# Patient Record
Sex: Male | Born: 1995 | Hispanic: Yes | Marital: Single | Smoking: Never smoker
Health system: Southern US, Community
[De-identification: ages and names within clinical notes are randomized; demographics above are authoritative.]

---

## 2022-01-21 ENCOUNTER — Emergency Department (HOSPITAL_COMMUNITY): Payer: Medicaid Other

## 2022-01-21 ENCOUNTER — Inpatient Hospital Stay (HOSPITAL_COMMUNITY)
Admission: EM | Admit: 2022-01-21 | Discharge: 2022-02-04 | DRG: 082 | Disposition: A | Discharge status: Rehabilitation Facility | Payer: Medicaid Other | Attending: Surgery | Admitting: Surgery

## 2022-01-21 ENCOUNTER — Inpatient Hospital Stay (HOSPITAL_COMMUNITY): Payer: Medicaid Other

## 2022-01-21 DIAGNOSIS — J150 Pneumonia due to Klebsiella pneumoniae: Secondary | ICD-10-CM | POA: Diagnosis not present

## 2022-01-21 DIAGNOSIS — S60511A Abrasion of right hand, initial encounter: Secondary | ICD-10-CM | POA: Diagnosis not present

## 2022-01-21 DIAGNOSIS — F0781 Postconcussional syndrome: Secondary | ICD-10-CM | POA: Diagnosis not present

## 2022-01-21 DIAGNOSIS — J9601 Acute respiratory failure with hypoxia: Secondary | ICD-10-CM | POA: Diagnosis present

## 2022-01-21 DIAGNOSIS — S3991XA Unspecified injury of abdomen, initial encounter: Secondary | ICD-10-CM | POA: Diagnosis not present

## 2022-01-21 DIAGNOSIS — R001 Bradycardia, unspecified: Secondary | ICD-10-CM | POA: Diagnosis not present

## 2022-01-21 DIAGNOSIS — S069XAA Unspecified intracranial injury with loss of consciousness status unknown, initial encounter: Secondary | ICD-10-CM | POA: Diagnosis present

## 2022-01-21 DIAGNOSIS — R451 Restlessness and agitation: Secondary | ICD-10-CM | POA: Diagnosis not present

## 2022-01-21 DIAGNOSIS — S066XAA Traumatic subarachnoid hemorrhage with loss of consciousness status unknown, initial encounter: Principal | ICD-10-CM | POA: Diagnosis present

## 2022-01-21 DIAGNOSIS — I609 Nontraumatic subarachnoid hemorrhage, unspecified: Secondary | ICD-10-CM | POA: Diagnosis not present

## 2022-01-21 DIAGNOSIS — S0081XA Abrasion of other part of head, initial encounter: Secondary | ICD-10-CM | POA: Diagnosis present

## 2022-01-21 DIAGNOSIS — T1490XA Injury, unspecified, initial encounter: Principal | ICD-10-CM

## 2022-01-21 DIAGNOSIS — Z20822 Contact with and (suspected) exposure to covid-19: Secondary | ICD-10-CM | POA: Diagnosis present

## 2022-01-21 DIAGNOSIS — S069X9A Unspecified intracranial injury with loss of consciousness of unspecified duration, initial encounter: Secondary | ICD-10-CM

## 2022-01-21 DIAGNOSIS — Z23 Encounter for immunization: Secondary | ICD-10-CM

## 2022-01-21 LAB — I-STAT CHEM 8, ED
BUN: 13 mg/dL (ref 6–20)
Calcium, Ion: 1.1 mmol/L — ABNORMAL LOW (ref 1.15–1.40)
Chloride: 106 mmol/L (ref 98–111)
Creatinine, Ser: 1 mg/dL (ref 0.61–1.24)
Glucose, Bld: 134 mg/dL — ABNORMAL HIGH (ref 70–99)
HCT: 44 % (ref 39.0–52.0)
Hemoglobin: 15 g/dL (ref 13.0–17.0)
Potassium: 3.3 mmol/L — ABNORMAL LOW (ref 3.5–5.1)
Sodium: 142 mmol/L (ref 135–145)
TCO2: 25 mmol/L (ref 22–32)

## 2022-01-21 LAB — URINALYSIS, ROUTINE W REFLEX MICROSCOPIC
Bilirubin Urine: NEGATIVE
Glucose, UA: NEGATIVE mg/dL
Hgb urine dipstick: NEGATIVE
Ketones, ur: NEGATIVE mg/dL
Leukocytes,Ua: NEGATIVE
Nitrite: NEGATIVE
Protein, ur: NEGATIVE mg/dL
Specific Gravity, Urine: 1.031 — ABNORMAL HIGH (ref 1.005–1.030)
pH: 7 (ref 5.0–8.0)

## 2022-01-21 LAB — CBC
HCT: 44.5 % (ref 39.0–52.0)
Hemoglobin: 15.2 g/dL (ref 13.0–17.0)
MCH: 32.7 pg (ref 26.0–34.0)
MCHC: 34.2 g/dL (ref 30.0–36.0)
MCV: 95.7 fL (ref 80.0–100.0)
Platelets: 275 10*3/uL (ref 150–400)
RBC: 4.65 MIL/uL (ref 4.22–5.81)
RDW: 11.7 % (ref 11.5–15.5)
WBC: 7.9 10*3/uL (ref 4.0–10.5)
nRBC: 0 % (ref 0.0–0.2)

## 2022-01-21 LAB — COMPREHENSIVE METABOLIC PANEL
ALT: 28 U/L (ref 0–44)
AST: 28 U/L (ref 15–41)
Albumin: 3.8 g/dL (ref 3.5–5.0)
Alkaline Phosphatase: 77 U/L (ref 38–126)
Anion gap: 10 (ref 5–15)
BUN: 13 mg/dL (ref 6–20)
CO2: 21 mmol/L — ABNORMAL LOW (ref 22–32)
Calcium: 8.9 mg/dL (ref 8.9–10.3)
Chloride: 109 mmol/L (ref 98–111)
Creatinine, Ser: 1.15 mg/dL (ref 0.61–1.24)
GFR, Estimated: >60 mL/min (ref >60)
Glucose, Bld: 136 mg/dL — ABNORMAL HIGH (ref 70–99)
Potassium: 3.2 mmol/L — ABNORMAL LOW (ref 3.5–5.1)
Sodium: 140 mmol/L (ref 135–145)
Total Bilirubin: 0.9 mg/dL (ref 0.3–1.2)
Total Protein: 7 g/dL (ref 6.5–8.1)

## 2022-01-21 LAB — RAPID URINE DRUG SCREEN, HOSP PERFORMED
Amphetamines: NOT DETECTED
Barbiturates: NOT DETECTED
Benzodiazepines: POSITIVE — AB
Cocaine: NOT DETECTED
Opiates: NOT DETECTED
Tetrahydrocannabinol: POSITIVE — AB

## 2022-01-21 LAB — I-STAT ARTERIAL BLOOD GAS, ED
Acid-Base Excess: 0 mmol/L (ref 0.0–2.0)
Bicarbonate: 22.7 mmol/L (ref 20.0–28.0)
Calcium, Ion: 1.18 mmol/L (ref 1.15–1.40)
HCT: 42 % (ref 39.0–52.0)
Hemoglobin: 14.3 g/dL (ref 13.0–17.0)
O2 Saturation: 100 %
Patient temperature: 97.1
Potassium: 3.4 mmol/L — ABNORMAL LOW (ref 3.5–5.1)
Sodium: 139 mmol/L (ref 135–145)
TCO2: 24 mmol/L (ref 22–32)
pCO2 arterial: 30.4 mmHg — ABNORMAL LOW (ref 32–48)
pH, Arterial: 7.478 — ABNORMAL HIGH (ref 7.35–7.45)
pO2, Arterial: 376 mmHg — ABNORMAL HIGH (ref 83–108)

## 2022-01-21 LAB — RESP PANEL BY RT-PCR (FLU A&B, COVID) ARPGX2
Influenza A by PCR: NEGATIVE
Influenza B by PCR: NEGATIVE
SARS Coronavirus 2 by RT PCR: NEGATIVE

## 2022-01-21 LAB — TYPE AND SCREEN
ABO/RH(D): A POS
Antibody Screen: NEGATIVE

## 2022-01-21 LAB — LACTIC ACID, PLASMA: Lactic Acid, Venous: 3.6 mmol/L (ref 0.5–1.9)

## 2022-01-21 LAB — HIV ANTIBODY (ROUTINE TESTING W REFLEX): HIV Screen 4th Generation wRfx: NONREACTIVE

## 2022-01-21 LAB — ABO/RH: ABO/RH(D): A POS

## 2022-01-21 LAB — PROTIME-INR
INR: 1 (ref 0.8–1.2)
Prothrombin Time: 12.9 seconds (ref 11.4–15.2)

## 2022-01-21 LAB — ETHANOL: Alcohol, Ethyl (B): <10 mg/dL (ref <10)

## 2022-01-21 LAB — MRSA NEXT GEN BY PCR, NASAL: MRSA by PCR Next Gen: NOT DETECTED

## 2022-01-21 MED ORDER — PROPOFOL 1000 MG/100ML IV EMUL
0.0000 ug/kg/min | INTRAVENOUS | Status: DC
Start: 2022-01-21 — End: 2022-01-21

## 2022-01-21 MED ORDER — FENTANYL CITRATE PF 50 MCG/ML IJ SOSY
PREFILLED_SYRINGE | INTRAMUSCULAR | Status: AC
  Filled 2022-01-21: qty 2

## 2022-01-21 MED ORDER — ONDANSETRON HCL 4 MG/2ML IJ SOLN
4.0000 mg | Freq: Four times a day (QID) | INTRAMUSCULAR | Status: DC | PRN
  Administered 2022-01-31: 4 mg via INTRAVENOUS
  Filled 2022-01-21: qty 2

## 2022-01-21 MED ORDER — MIDAZOLAM HCL 2 MG/2ML IJ SOLN
INTRAMUSCULAR | Status: AC
  Filled 2022-01-21: qty 2

## 2022-01-21 MED ORDER — LEVETIRACETAM IN NACL 500 MG/100ML IV SOLN
500.0000 mg | Freq: Two times a day (BID) | INTRAVENOUS | Status: AC
  Administered 2022-01-21 – 2022-01-28 (×14): 500 mg via INTRAVENOUS
  Filled 2022-01-21 (×15): qty 100

## 2022-01-21 MED ORDER — SODIUM CHLORIDE 0.9 % IV SOLN: INTRAVENOUS | Status: DC

## 2022-01-21 MED ORDER — FENTANYL CITRATE PF 50 MCG/ML IJ SOSY
50.0000 ug | PREFILLED_SYRINGE | Freq: Once | INTRAMUSCULAR | Status: AC
  Administered 2022-01-21: 50 ug via INTRAVENOUS
  Filled 2022-01-21: qty 1

## 2022-01-21 MED ORDER — ROCURONIUM BROMIDE 10 MG/ML (PF) SYRINGE
PREFILLED_SYRINGE | INTRAVENOUS | Status: AC
  Filled 2022-01-21: qty 10

## 2022-01-21 MED ORDER — ETOMIDATE 2 MG/ML IV SOLN
INTRAVENOUS | Status: AC
  Filled 2022-01-21: qty 20

## 2022-01-21 MED ORDER — ROCURONIUM BROMIDE 50 MG/5ML IV SOLN
INTRAVENOUS | Status: AC | PRN
Start: 2022-01-21 — End: 2022-01-21
  Administered 2022-01-21: 100 mg via INTRAVENOUS

## 2022-01-21 MED ORDER — ORAL CARE MOUTH RINSE
15.0000 mL | OROMUCOSAL | Status: DC
  Administered 2022-01-21 – 2022-02-02 (×114): 15 mL via OROMUCOSAL

## 2022-01-21 MED ORDER — METOPROLOL TARTRATE 5 MG/5ML IV SOLN: 5.0000 mg | Freq: Four times a day (QID) | INTRAVENOUS | Status: DC | PRN

## 2022-01-21 MED ORDER — CEFAZOLIN SODIUM-DEXTROSE 2-4 GM/100ML-% IV SOLN
2.0000 g | Freq: Once | INTRAVENOUS | Status: AC
  Administered 2022-01-21: 2 g via INTRAVENOUS
  Filled 2022-01-21: qty 100

## 2022-01-21 MED ORDER — FENTANYL CITRATE PF 50 MCG/ML IJ SOSY: 50.0000 ug | PREFILLED_SYRINGE | Freq: Once | INTRAMUSCULAR | Status: DC

## 2022-01-21 MED ORDER — FENTANYL BOLUS VIA INFUSION
50.0000 ug | INTRAVENOUS | Status: DC | PRN
  Administered 2022-01-21 (×2): 50 ug via INTRAVENOUS
  Filled 2022-01-21: qty 100

## 2022-01-21 MED ORDER — SUCCINYLCHOLINE CHLORIDE 200 MG/10ML IV SOSY
PREFILLED_SYRINGE | INTRAVENOUS | Status: AC
  Filled 2022-01-21: qty 10

## 2022-01-21 MED ORDER — HYDRALAZINE HCL 20 MG/ML IJ SOLN: 10.0000 mg | INTRAMUSCULAR | Status: DC | PRN

## 2022-01-21 MED ORDER — TETANUS-DIPHTH-ACELL PERTUSSIS 5-2.5-18.5 LF-MCG/0.5 IM SUSY
0.5000 mL | PREFILLED_SYRINGE | Freq: Once | INTRAMUSCULAR | Status: AC
  Administered 2022-01-21: 0.5 mL via INTRAMUSCULAR
  Filled 2022-01-21: qty 0.5

## 2022-01-21 MED ORDER — IOHEXOL 300 MG/ML  SOLN
100.0000 mL | Freq: Once | INTRAMUSCULAR | Status: AC | PRN
  Administered 2022-01-21: 100 mL via INTRAVENOUS

## 2022-01-21 MED ORDER — PANTOPRAZOLE SODIUM 40 MG IV SOLR
40.0000 mg | Freq: Every day | INTRAVENOUS | Status: DC
  Administered 2022-01-21 – 2022-01-24 (×4): 40 mg via INTRAVENOUS
  Filled 2022-01-21 (×4): qty 10

## 2022-01-21 MED ORDER — CLEVIDIPINE BUTYRATE 0.5 MG/ML IV EMUL
0.0000 mg/h | INTRAVENOUS | Status: DC
  Administered 2022-01-21: 2 mg/h via INTRAVENOUS
  Filled 2022-01-21: qty 50

## 2022-01-21 MED ORDER — KETAMINE HCL 50 MG/5ML IJ SOSY
PREFILLED_SYRINGE | INTRAMUSCULAR | Status: AC
  Filled 2022-01-21: qty 5

## 2022-01-21 MED ORDER — FENTANYL 2500MCG IN NS 250ML (10MCG/ML) PREMIX INFUSION
50.0000 ug/h | INTRAVENOUS | Status: DC
  Administered 2022-01-21 – 2022-01-22 (×2): 100 ug/h via INTRAVENOUS
  Administered 2022-01-23 (×2): 200 ug/h via INTRAVENOUS
  Administered 2022-01-24: 150 ug/h via INTRAVENOUS
  Administered 2022-01-24: 200 ug/h via INTRAVENOUS
  Administered 2022-01-25 – 2022-01-26 (×4): 150 ug/h via INTRAVENOUS
  Administered 2022-01-27: 200 ug/h via INTRAVENOUS
  Administered 2022-01-27 – 2022-01-28 (×2): 175 ug/h via INTRAVENOUS
  Administered 2022-01-28 – 2022-01-29 (×3): 200 ug/h via INTRAVENOUS
  Filled 2022-01-21 (×15): qty 250

## 2022-01-21 MED ORDER — ETOMIDATE 2 MG/ML IV SOLN
INTRAVENOUS | Status: AC | PRN
  Administered 2022-01-21: 20 mg via INTRAVENOUS

## 2022-01-21 MED ORDER — FENTANYL BOLUS VIA INFUSION
50.0000 ug | INTRAVENOUS | Status: DC | PRN
  Administered 2022-01-22 – 2022-01-25 (×2): 100 ug via INTRAVENOUS
  Filled 2022-01-21: qty 100

## 2022-01-21 MED ORDER — CHLORHEXIDINE GLUCONATE 0.12% ORAL RINSE (MEDLINE KIT)
15.0000 mL | Freq: Two times a day (BID) | OROMUCOSAL | Status: DC
  Administered 2022-01-21 – 2022-02-01 (×23): 15 mL via OROMUCOSAL

## 2022-01-21 MED ORDER — SODIUM CHLORIDE 0.9 % IV SOLN: INTRAVENOUS | Status: AC

## 2022-01-21 MED ORDER — PROPOFOL 1000 MG/100ML IV EMUL
0.0000 ug/kg/min | INTRAVENOUS | Status: DC
  Administered 2022-01-21: 50 ug/kg/min via INTRAVENOUS
  Administered 2022-01-21: 45 ug/kg/min via INTRAVENOUS
  Administered 2022-01-21: 20 ug/kg/min via INTRAVENOUS
  Administered 2022-01-22: 40 ug/kg/min via INTRAVENOUS
  Administered 2022-01-22: 30 ug/kg/min via INTRAVENOUS
  Administered 2022-01-22 (×2): 40 ug/kg/min via INTRAVENOUS
  Administered 2022-01-23 – 2022-01-24 (×5): 30 ug/kg/min via INTRAVENOUS
  Administered 2022-01-24: 35 ug/kg/min via INTRAVENOUS
  Administered 2022-01-24: 30 ug/kg/min via INTRAVENOUS
  Administered 2022-01-24 – 2022-01-25 (×3): 40 ug/kg/min via INTRAVENOUS
  Administered 2022-01-25: 25 ug/kg/min via INTRAVENOUS
  Administered 2022-01-25 – 2022-01-26 (×3): 35 ug/kg/min via INTRAVENOUS
  Administered 2022-01-26 (×2): 30 ug/kg/min via INTRAVENOUS
  Administered 2022-01-26 – 2022-01-27 (×2): 40 ug/kg/min via INTRAVENOUS
  Administered 2022-01-27 (×2): 45 ug/kg/min via INTRAVENOUS
  Administered 2022-01-27: 35 ug/kg/min via INTRAVENOUS
  Administered 2022-01-28 (×2): 50 ug/kg/min via INTRAVENOUS
  Administered 2022-01-28: 40 ug/kg/min via INTRAVENOUS
  Administered 2022-01-28: 50 ug/kg/min via INTRAVENOUS
  Administered 2022-01-28: 40 ug/kg/min via INTRAVENOUS
  Administered 2022-01-28 – 2022-01-29 (×2): 50 ug/kg/min via INTRAVENOUS
  Filled 2022-01-21 (×9): qty 100
  Filled 2022-01-21: qty 200
  Filled 2022-01-21 (×21): qty 100
  Filled 2022-01-21: qty 200
  Filled 2022-01-21 (×4): qty 100

## 2022-01-21 MED ORDER — CHLORHEXIDINE GLUCONATE CLOTH 2 % EX PADS
6.0000 | MEDICATED_PAD | Freq: Every day | CUTANEOUS | Status: DC
  Administered 2022-01-21 – 2022-02-03 (×14): 6 via TOPICAL

## 2022-01-21 MED ORDER — PANTOPRAZOLE SODIUM 40 MG PO TBEC: 40.0000 mg | DELAYED_RELEASE_TABLET | Freq: Every day | ORAL | Status: DC

## 2022-01-21 MED ORDER — DEXAMETHASONE SODIUM PHOSPHATE 10 MG/ML IJ SOLN
8.0000 mg | Freq: Once | INTRAMUSCULAR | Status: AC
  Administered 2022-01-21: 8 mg via INTRAVENOUS
  Filled 2022-01-21: qty 1

## 2022-01-21 MED ORDER — FENTANYL 2500MCG IN NS 250ML (10MCG/ML) PREMIX INFUSION
50.0000 ug/h | INTRAVENOUS | Status: DC
  Administered 2022-01-21: 50 ug/h via INTRAVENOUS
  Filled 2022-01-21: qty 250

## 2022-01-21 MED ORDER — ONDANSETRON 4 MG PO TBDP: 4.0000 mg | ORAL_TABLET | Freq: Four times a day (QID) | ORAL | Status: DC | PRN

## 2022-01-21 MED ORDER — MIDAZOLAM HCL 5 MG/5ML IJ SOLN
INTRAMUSCULAR | Status: AC | PRN
  Administered 2022-01-21: 5 mg via INTRAMUSCULAR
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
## 2022-01-21 NOTE — ED Notes (Signed)
RN pulled 6 mg versed (3 vials) as override, discrepancy fixed with Jonathon (pharmacy). 5 mg versed given IM, 1 mg wasted by this RN and Scientist, forensic.   ?

## 2022-01-21 NOTE — Plan of Care (Signed)
  Problem: Pain Managment: Goal: General experience of comfort will improve Outcome: Progressing   Problem: Safety: Goal: Ability to remain free from injury will improve Outcome: Progressing   Problem: Skin Integrity: Goal: Risk for impaired skin integrity will decrease Outcome: Progressing   

## 2022-01-21 NOTE — ED Notes (Signed)
Patient transported to CT 
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
## 2022-01-21 NOTE — ED Notes (Addendum)
Dr Wallace Cullens aware of patient's HR in the 40s. ?
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
## 2022-01-21 NOTE — ED Notes (Signed)
Belongings given to wife - one earring, helmet, clothing, wallet ?

## 2022-01-21 NOTE — ED Notes (Signed)
Dr. Trenton Gammon, neurosurgery, at bedside ?

## 2022-01-21 NOTE — Progress Notes (Signed)
?   01/21/22 1800  ?Patient Belongings  ?Patient/Family advised about valuables policy? Yes  ?Home Medications No meds brought to hospital  ?Patient Belongings Sent with patient's designated person  ?Belongings with Patient's Designated Person Jewelry;Electronic device(s);Clothing  ?Name of Designated Person Reuben Likes Disla (cousin's wife)  ?With Designated Person: Jewelry Earrings  ? ? ?

## 2022-01-21 NOTE — ED Notes (Signed)
Dr. Wallace Cullens informed of patient's Lactic Acid of 3.6. No new verbal orders received. ?

## 2022-01-21 NOTE — ED Notes (Signed)
Family at bedside. 

## 2022-01-21 NOTE — Progress Notes (Signed)
Patient transported to CT and back to trauma C without complication. 
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
## 2022-01-22 ENCOUNTER — Inpatient Hospital Stay (HOSPITAL_COMMUNITY): Payer: Medicaid Other

## 2022-01-22 LAB — BASIC METABOLIC PANEL
Anion gap: 8 (ref 5–15)
BUN: 9 mg/dL (ref 6–20)
CO2: 21 mmol/L — ABNORMAL LOW (ref 22–32)
Calcium: 8.6 mg/dL — ABNORMAL LOW (ref 8.9–10.3)
Chloride: 110 mmol/L (ref 98–111)
Creatinine, Ser: 0.93 mg/dL (ref 0.61–1.24)
GFR, Estimated: >60 mL/min (ref >60)
Glucose, Bld: 122 mg/dL — ABNORMAL HIGH (ref 70–99)
Potassium: 3.2 mmol/L — ABNORMAL LOW (ref 3.5–5.1)
Sodium: 139 mmol/L (ref 135–145)

## 2022-01-22 LAB — GLUCOSE, CAPILLARY: Glucose-Capillary: 125 mg/dL — ABNORMAL HIGH (ref 70–99)

## 2022-01-22 LAB — CBC
HCT: 40.4 % (ref 39.0–52.0)
Hemoglobin: 13.9 g/dL (ref 13.0–17.0)
MCH: 32.1 pg (ref 26.0–34.0)
MCHC: 34.4 g/dL (ref 30.0–36.0)
MCV: 93.3 fL (ref 80.0–100.0)
Platelets: 222 10*3/uL (ref 150–400)
RBC: 4.33 MIL/uL (ref 4.22–5.81)
RDW: 11.7 % (ref 11.5–15.5)
WBC: 16.4 10*3/uL — ABNORMAL HIGH (ref 4.0–10.5)
nRBC: 0 % (ref 0.0–0.2)

## 2022-01-22 LAB — TRIGLYCERIDES: Triglycerides: 70 mg/dL (ref <150)

## 2022-01-22 MED ORDER — POTASSIUM CHLORIDE 10 MEQ/100ML IV SOLN
10.0000 meq | INTRAVENOUS | Status: AC
  Administered 2022-01-22 (×6): 10 meq via INTRAVENOUS
  Filled 2022-01-22 (×6): qty 100

## 2022-01-22 MED ORDER — MIDAZOLAM HCL 2 MG/2ML IJ SOLN
2.0000 mg | INTRAMUSCULAR | Status: DC | PRN
  Administered 2022-01-22 – 2022-01-29 (×15): 2 mg via INTRAVENOUS
  Filled 2022-01-22 (×16): qty 2

## 2022-01-22 NOTE — Progress Notes (Signed)
Trauma Event Note ? ?TRN rounding- pt asleep, primary RN voices no concerns.  ? ? ? ?Last imported Vital Signs ?BP (!) 109/57 (BP Location: Left Arm)   Pulse 83   Temp 97.6 ?F (36.4 ?C)   Resp 16   Ht 5\' 9"  (1.753 m)   Wt 202 lb 13.2 oz (92 kg)   SpO2 97%   BMI 29.95 kg/m?  ? ?Trending CBC ?Recent Labs  ?  01/21/22 ?1430 01/21/22 ?1441 01/21/22 ?1548 01/22/22 ?0256  ?WBC 7.9  --   --  16.4*  ?HGB 15.2 15.0 14.3 13.9  ?HCT 44.5 44.0 42.0 40.4  ?PLT 275  --   --  222  ? ? ?Trending Coag's ?Recent Labs  ?  01/21/22 ?1430  ?INR 1.0  ? ? ?Trending BMET ?Recent Labs  ?  01/21/22 ?1430 01/21/22 ?1441 01/21/22 ?1548 01/22/22 ?0256  ?NA 140 142 139 139  ?K 3.2* 3.3* 3.4* 3.2*  ?CL 109 106  --  110  ?CO2 21*  --   --  21*  ?BUN 13 13  --  9  ?CREATININE 1.15 1.00  --  0.93  ?GLUCOSE 136* 134*  --  122*  ? ? ? ? ?01/24/22 James Howell  ?Trauma Response RN ? ?Please call TRN at [phone] for further assistance. ? ? ?  ?

## 2022-01-22 NOTE — Progress Notes (Signed)
No new events or problems overnight.  Patient continues to be pharmacologically sedated.  Attempts at lessening sedation this morning were met with extreme agitation and attempts to get out of bed on his own.  Patient's strength appears to be equal bilaterally. ? ?Follow-up head CT scan demonstrates stable appearance of some scattered traumatic subarachnoid hemorrhage and intraventricular blood but no evidence of any worsening contusions.  No evidence of progressive edema or mass effect. ? ?Status post severe traumatic brain injury.  Continue sedation and supportive care for now.  Plan to work toward extubation possibly tomorrow. ?

## 2022-01-22 NOTE — Progress Notes (Signed)
Pt transported to CT and back to [id number] w/o complication. RT will cont to monitor.  ?

## 2022-01-22 NOTE — Progress Notes (Signed)
Dr. Fredricka Bonine notified of pt heart rate of 32 unsustained for approximately 30 seconds. RN able to increase HR through physical stimulation and propofol decrease. Fentanyl increased to maintain sedation. BP 150/95. Pt moving all extremities purposefully and beginning to bite on ET tube. PRN order for Versed given to manage agitation.  ?

## 2022-01-22 NOTE — Progress Notes (Signed)
Follow up - Trauma Critical Care ?  ?Patient Details:  ?  ?[facility] James Howell is an 26 y.o. male. ? ?Lines/tubes ?: ?Airway 8 mm (Active)  ?Secured at (cm) 26 cm 01/22/22 0810  ?Measured From Lips 01/22/22 0810  ?Secured Location Center 01/22/22 [phone]  ?Secured By [employer] 01/22/22 0810  ?Tube Holder Repositioned Yes 01/22/22 0810  ?Prone position No 01/22/22 0334  ?Cuff Pressure (cm H2O) Clear OR 27-39 CmH2O 01/22/22 0810  ?Site Condition Dry 01/22/22 0810  ?   ?NG/OG Vented/Dual Lumen 16 Fr. Oral Marking at nare/corner of mouth (Active)  ?Tube Position (Required) External length of tube 01/22/22 0800  ?Measurement (cm) (Required) 64 cm 01/22/22 0800  ?Ongoing Placement Verification (Required) (See row information) Yes 01/22/22 0800  ?Site Assessment Clean, Dry, Intact;Tape intact 01/22/22 0800  ?Interventions Clamped 01/22/22 0800  ?Status Clamped 01/22/22 0800  ?Output (mL) 50 mL 01/22/22 0600  ?   ?Urethral Catheter Leota Sauers RN Non-latex 16 Fr. (Active)  ?Indication for Insertion or Continuance of Catheter Unstable critically ill patients first 24-48 hours (See Criteria) 01/22/22 0800  ?Site Assessment Clean, Dry, Intact 01/22/22 0800  ?Catheter Maintenance Bag below level of bladder;Catheter secured;Drainage bag/tubing not touching floor;Insertion date on drainage bag;No dependent loops;Seal intact 01/22/22 0800  ?Collection Container Standard drainage bag 01/22/22 0800  ?Securement Method Securing device (Describe) 01/22/22 0800  ?Urinary Catheter Interventions (if applicable) Unclamped 01/22/22 0800  ?Output (mL) 750 mL 01/22/22 0400  ? ? ?Microbiology/Sepsis markers: ?Results for orders placed or performed during the hospital encounter of 01/21/22  ?Resp Panel by RT-PCR (Flu A&B, Covid) Nasopharyngeal Swab     Status: None  ? Collection Time: 01/21/22  3:41 PM  ? Specimen: Nasopharyngeal Swab; Nasopharyngeal(NP) swabs in vial transport medium  ?Result Value Ref Range Status  ? SARS  Coronavirus 2 by RT PCR NEGATIVE NEGATIVE Final  ?  Comment: (NOTE) ?SARS-CoV-2 target nucleic acids are NOT DETECTED. ? ?The SARS-CoV-2 RNA is generally detectable in upper respiratory ?specimens during the acute phase of infection. The lowest ?concentration of SARS-CoV-2 viral copies this assay can detect is ?138 copies/mL. A negative result does not preclude SARS-Cov-2 ?infection and should not be used as the sole basis for treatment or ?other patient management decisions. A negative result may occur with  ?improper specimen collection/handling, submission of specimen other ?than nasopharyngeal swab, presence of viral mutation(s) within the ?areas targeted by this assay, and inadequate number of viral ?copies(<138 copies/mL). A negative result must be combined with ?clinical observations, patient history, and epidemiological ?information. The expected result is Negative. ? ?Fact Sheet for Patients:  ?BloggerCourse.com ? ?Fact Sheet for Healthcare Providers:  ?SeriousBroker.it ? ?This test is no t yet approved or cleared by the [place] FDA and  ?has been authorized for detection and/or diagnosis of SARS-CoV-2 by ?FDA under an Emergency Use Authorization (EUA). This EUA will remain  ?in effect (meaning this test can be used) for the duration of the ?COVID-19 declaration under Section 564(b)(1) of the Act, 21 ?U.S.C.section 360bbb-3(b)(1), unless the authorization is terminated  ?or revoked sooner.  ? ? ?  ? Influenza A by PCR NEGATIVE NEGATIVE Final  ? Influenza B by PCR NEGATIVE NEGATIVE Final  ?  Comment: (NOTE) ?The Xpert Xpress SARS-CoV-2/FLU/RSV plus assay is intended as an aid ?in the diagnosis of influenza from Nasopharyngeal swab specimens and ?should not be used as a sole basis for treatment. Nasal washings and ?aspirates are unacceptable for Xpert Xpress SARS-CoV-2/FLU/RSV ?testing. ? ?Fact  Sheet for  Patients: ?BloggerCourse.comhttps://www.fda.gov/media/152166/download ? ?Fact Sheet for Healthcare Providers: ?SeriousBroker.ithttps://www.fda.gov/media/152162/download ? ?This test is not yet approved or cleared by the [place] FDA and ?has been authorized for detection and/or diagnosis of SARS-CoV-2 by ?FDA under an Emergency Use Authorization (EUA). This EUA will remain ?in effect (meaning this test can be used) for the duration of the ?COVID-19 declaration under Section 564(b)(1) of the Act, 21 U.S.C. ?section 360bbb-3(b)(1), unless the authorization is terminated or ?revoked. ? ?Performed at [facility], [address] [place], [place] ?[address] ?  ?MRSA Next Gen by PCR, Nasal     Status: None  ? Collection Time: 01/21/22  5:14 PM  ? Specimen: Nasal Mucosa; Nasal Swab  ?Result Value Ref Range Status  ? MRSA by PCR Next Gen NOT DETECTED NOT DETECTED Final  ?  Comment: (NOTE) ?The GeneXpert MRSA Assay (FDA approved for NASAL specimens only), ?is one component of a comprehensive MRSA colonization surveillance ?program. It is not intended to diagnose MRSA infection nor to guide ?or monitor treatment for MRSA infections. ?Test performance is not FDA approved in patients less than 2 years ?old. ?Performed at [facility], [address] [place], [place] ?[address] ?  ? ? ?Anti-infectives:  ?Anti-infectives (From admission, onward)  ? ? Start     Dose/Rate Route Frequency Ordered Stop  ? 01/21/22 1445  ceFAZolin (ANCEF) IVPB 2g/100 mL premix       ? 2 g ?200 mL/hr over 30 Minutes Intravenous  Once 01/21/22 1438 01/21/22 1543  ? ?  ? ? ?Best Practice/Protocols:  ?VTE Prophylaxis: Mechanical ?Continous Sedation ? ?Consults: ?Treatment Team:  ?Md, Trauma, MD ?[name], [name], MD  ? ? ?Studies: ? ? ? ?Events: ? ?Subjective:  ?  ?Overnight Issues: No acute events. Some bradycardia overnight but stable. Agitated when sedation weaned.  ? ?Objective:  ?Vital signs for last 24 hours: ?Temp:  [95.9 ?F (35.5 ?C)-98.4 ?F (36.9 ?C)] 97.6 ?F (36.4  ?C) (04/23 0800) ?Pulse Rate:  [44-103] 83 (04/23 0810) ?Resp:  [12-24] 16 (04/23 0810) ?BP: (102-176)/(52-120) 109/57 (04/23 0800) ?SpO2:  [77 %-100 %] 97 % (04/23 0810) ?FiO2 (%):  [60 %-100 %] 60 % (04/23 0810) ?Weight:  [92 kg] 92 kg (04/22 1710) ? ?Hemodynamic parameters for last 24 hours: ?  ? ?Intake/Output from previous day: ?04/22 0701 - 04/23 0700 ?In: 2382.7 [I.V.:2082.3; IV Piggyback:300.3] ?Out: 1675 [Urine:1550; Emesis/NG output:125]  ?Intake/Output this shift: ?Total I/O ?In: 310.7 [I.V.:310.7] ?Out: -  ? ?Vent settings for last 24 hours: ?Vent Mode: PRVC ?FiO2 (%):  [60 %-100 %] 60 % ?Set Rate:  [16 bmp-18 bmp] 16 bmp ?Vt Set:  [550 mL-560 mL] 560 mL ?PEEP:  [5 cmH20] 5 cmH20 ?Plateau Pressure:  [11 cmH20-18 cmH20] 14 cmH20 ? ?Physical Exam:  ?Sedated, calm ?On vent 60%/PEEP 5, sats97% ?HR 80s, normotensive ?Contusion to left brow, ?PERRL, purposeful with all extremities but not f/c ?No extremity edema or deformity ? ?Results for orders placed or performed during the hospital encounter of 01/21/22 (from the past 24 hour(s))  ?Comprehensive metabolic panel     Status: Abnormal  ? Collection Time: 01/21/22  2:30 PM  ?Result Value Ref Range  ? Sodium 140 135 - 145 mmol/L  ? Potassium 3.2 (L) 3.5 - 5.1 mmol/L  ? Chloride 109 98 - 111 mmol/L  ? CO2 21 (L) 22 - 32 mmol/L  ? Glucose, Bld 136 (H) 70 - 99 mg/dL  ? BUN 13 6 - 20 mg/dL  ?  Creatinine, Ser 1.15 0.61 - 1.24 mg/dL  ? Calcium 8.9 8.9 - 10.3 mg/dL  ? Total Protein 7.0 6.5 - 8.1 g/dL  ? Albumin 3.8 3.5 - 5.0 g/dL  ? AST 28 15 - 41 U/L  ? ALT 28 0 - 44 U/L  ? Alkaline Phosphatase 77 38 - 126 U/L  ? Total Bilirubin 0.9 0.3 - 1.2 mg/dL  ? GFR, Estimated >60 >60 mL/min  ? Anion gap 10 5 - 15  ?CBC     Status: None  ? Collection Time: 01/21/22  2:30 PM  ?Result Value Ref Range  ? WBC 7.9 4.0 - 10.5 K/uL  ? RBC 4.65 4.22 - 5.81 MIL/uL  ? Hemoglobin 15.2 13.0 - 17.0 g/dL  ? HCT 44.5 39.0 - 52.0 %  ? MCV 95.7 80.0 - 100.0 fL  ? MCH 32.7 26.0 - 34.0 pg  ? MCHC  34.2 30.0 - 36.0 g/dL  ? RDW 11.7 11.5 - 15.5 %  ? Platelets 275 150 - 400 K/uL  ? nRBC 0.0 0.0 - 0.2 %  ?Ethanol     Status: None  ? Collection Time: 01/21/22  2:30 PM  ?Result Value Ref Range  ? Alcohol, Ethyl (B) <10 <10 mg/dL  ?Lactic acid, plasma     S

## 2022-01-22 NOTE — Progress Notes (Signed)
Dr. Fredricka Bonine notified of pt bradycardia episodes of low 40s. BP 108/57 (72), pt moving all extremities purposefully. RN instructed to continue current therapeutic regimen since patient's status is stable at this time.  ?

## 2022-01-23 ENCOUNTER — Inpatient Hospital Stay (HOSPITAL_COMMUNITY): Payer: Medicaid Other

## 2022-01-23 LAB — PHOSPHORUS
Phosphorus: 2.2 mg/dL — ABNORMAL LOW (ref 2.5–4.6)
Phosphorus: 2.5 mg/dL (ref 2.5–4.6)

## 2022-01-23 LAB — MAGNESIUM
Magnesium: 1.8 mg/dL (ref 1.7–2.4)
Magnesium: 2.4 mg/dL (ref 1.7–2.4)

## 2022-01-23 LAB — BASIC METABOLIC PANEL
Anion gap: 5 (ref 5–15)
BUN: 11 mg/dL (ref 6–20)
CO2: 22 mmol/L (ref 22–32)
Calcium: 8.2 mg/dL — ABNORMAL LOW (ref 8.9–10.3)
Chloride: 116 mmol/L — ABNORMAL HIGH (ref 98–111)
Creatinine, Ser: 1.03 mg/dL (ref 0.61–1.24)
GFR, Estimated: >60 mL/min (ref >60)
Glucose, Bld: 96 mg/dL (ref 70–99)
Potassium: 3.2 mmol/L — ABNORMAL LOW (ref 3.5–5.1)
Sodium: 143 mmol/L (ref 135–145)

## 2022-01-23 LAB — GLUCOSE, CAPILLARY
Glucose-Capillary: 100 mg/dL — ABNORMAL HIGH (ref 70–99)
Glucose-Capillary: 102 mg/dL — ABNORMAL HIGH (ref 70–99)
Glucose-Capillary: 83 mg/dL (ref 70–99)
Glucose-Capillary: 127 mg/dL — ABNORMAL HIGH (ref 70–99)

## 2022-01-23 MED ORDER — PROSOURCE TF PO LIQD
45.0000 mL | Freq: Two times a day (BID) | ORAL | Status: DC
  Administered 2022-01-23: 45 mL
  Filled 2022-01-23: qty 45

## 2022-01-23 MED ORDER — CLONAZEPAM 0.5 MG PO TABS
0.5000 mg | ORAL_TABLET | Freq: Two times a day (BID) | ORAL | Status: DC
  Administered 2022-01-23 (×2): 0.5 mg
  Filled 2022-01-23 (×2): qty 1

## 2022-01-23 MED ORDER — POLYETHYLENE GLYCOL 3350 17 G PO PACK
17.0000 g | PACK | Freq: Every day | ORAL | Status: DC
  Administered 2022-01-23 – 2022-01-25 (×3): 17 g
  Filled 2022-01-23 (×3): qty 1

## 2022-01-23 MED ORDER — PIVOT 1.5 CAL PO LIQD
1000.0000 mL | ORAL | Status: DC
  Administered 2022-01-23 – 2022-02-01 (×10): 1000 mL
  Filled 2022-01-23 (×2): qty 1000

## 2022-01-23 MED ORDER — VITAL HIGH PROTEIN PO LIQD: 1000.0000 mL | ORAL | Status: AC

## 2022-01-23 MED ORDER — VITAL HIGH PROTEIN PO LIQD
1000.0000 mL | ORAL | Status: DC
  Administered 2022-01-23: 1000 mL

## 2022-01-23 MED ORDER — VITAL HIGH PROTEIN PO LIQD: 1000.0000 mL | ORAL | Status: DC

## 2022-01-23 MED ORDER — DOCUSATE SODIUM 50 MG/5ML PO LIQD
100.0000 mg | Freq: Two times a day (BID) | ORAL | Status: DC
Start: 2022-01-23 — End: 2022-02-02
  Administered 2022-01-23 – 2022-01-31 (×13): 100 mg
  Filled 2022-01-23 (×14): qty 10

## 2022-01-23 MED ORDER — QUETIAPINE FUMARATE 100 MG PO TABS
100.0000 mg | ORAL_TABLET | Freq: Two times a day (BID) | ORAL | Status: DC
  Administered 2022-01-23 – 2022-01-30 (×15): 100 mg
  Filled 2022-01-23 (×15): qty 1

## 2022-01-23 MED ORDER — MAGNESIUM SULFATE 2 GM/50ML IV SOLN
2.0000 g | Freq: Once | INTRAVENOUS | Status: AC
  Administered 2022-01-23: 2 g via INTRAVENOUS
  Filled 2022-01-23: qty 50

## 2022-01-23 MED ORDER — POTASSIUM CHLORIDE 20 MEQ PO PACK
40.0000 meq | PACK | ORAL | Status: AC
  Administered 2022-01-23 (×2): 40 meq
  Filled 2022-01-23 (×2): qty 2
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
## 2022-01-23 NOTE — Progress Notes (Signed)
Remains sedated on ventilator.  Still with severe agitation when sedation lessened.  Moving all 4 extremities well.  Strength equal.  Pupils small. ? ?Status post traumatic brain injury.  Continue supportive efforts.  No new recommendations. ?

## 2022-01-24 ENCOUNTER — Inpatient Hospital Stay (HOSPITAL_COMMUNITY): Payer: Medicaid Other

## 2022-01-24 LAB — GLUCOSE, CAPILLARY
Glucose-Capillary: 146 mg/dL — ABNORMAL HIGH (ref 70–99)
Glucose-Capillary: 129 mg/dL — ABNORMAL HIGH (ref 70–99)
Glucose-Capillary: 160 mg/dL — ABNORMAL HIGH (ref 70–99)
Glucose-Capillary: 126 mg/dL — ABNORMAL HIGH (ref 70–99)
Glucose-Capillary: 133 mg/dL — ABNORMAL HIGH (ref 70–99)
Glucose-Capillary: 141 mg/dL — ABNORMAL HIGH (ref 70–99)

## 2022-01-24 LAB — CBC
HCT: 37.7 % — ABNORMAL LOW (ref 39.0–52.0)
Hemoglobin: 12.7 g/dL — ABNORMAL LOW (ref 13.0–17.0)
MCH: 32.5 pg (ref 26.0–34.0)
MCHC: 33.7 g/dL (ref 30.0–36.0)
MCV: 96.4 fL (ref 80.0–100.0)
Platelets: 184 10*3/uL (ref 150–400)
RBC: 3.91 MIL/uL — ABNORMAL LOW (ref 4.22–5.81)
RDW: 11.9 % (ref 11.5–15.5)
WBC: 16.3 10*3/uL — ABNORMAL HIGH (ref 4.0–10.5)
nRBC: 0 % (ref 0.0–0.2)

## 2022-01-24 LAB — BASIC METABOLIC PANEL
Anion gap: 7 (ref 5–15)
BUN: 10 mg/dL (ref 6–20)
CO2: 20 mmol/L — ABNORMAL LOW (ref 22–32)
Calcium: 7.6 mg/dL — ABNORMAL LOW (ref 8.9–10.3)
Chloride: 109 mmol/L (ref 98–111)
Creatinine, Ser: 0.91 mg/dL (ref 0.61–1.24)
GFR, Estimated: >60 mL/min (ref >60)
Glucose, Bld: 121 mg/dL — ABNORMAL HIGH (ref 70–99)
Potassium: 3.6 mmol/L (ref 3.5–5.1)
Sodium: 136 mmol/L (ref 135–145)

## 2022-01-24 LAB — MAGNESIUM
Magnesium: 1.9 mg/dL (ref 1.7–2.4)
Magnesium: 2.1 mg/dL (ref 1.7–2.4)

## 2022-01-24 LAB — PHOSPHORUS
Phosphorus: 3.1 mg/dL (ref 2.5–4.6)
Phosphorus: 2.6 mg/dL (ref 2.5–4.6)

## 2022-01-24 MED ORDER — OXYCODONE HCL 5 MG/5ML PO SOLN
5.0000 mg | ORAL | Status: DC | PRN
  Administered 2022-02-01 – 2022-02-02 (×5): 5 mg
  Filled 2022-01-24 (×5): qty 5

## 2022-01-24 MED ORDER — METHOCARBAMOL 500 MG PO TABS
1000.0000 mg | ORAL_TABLET | Freq: Three times a day (TID) | ORAL | Status: DC
  Administered 2022-01-24 – 2022-02-02 (×26): 1000 mg
  Filled 2022-01-24 (×26): qty 2

## 2022-01-24 MED ORDER — OXYCODONE HCL 5 MG/5ML PO SOLN
10.0000 mg | ORAL | Status: DC | PRN
  Administered 2022-01-26 – 2022-02-02 (×9): 10 mg
  Filled 2022-01-24 (×10): qty 10

## 2022-01-24 MED ORDER — LACTULOSE 10 GM/15ML PO SOLN
10.0000 g | Freq: Two times a day (BID) | ORAL | Status: DC
  Administered 2022-01-24 – 2022-01-25 (×4): 10 g
  Filled 2022-01-24 (×4): qty 15

## 2022-01-24 MED ORDER — OXYCODONE HCL 5 MG/5ML PO SOLN: 5.0000 mg | ORAL | Status: DC | PRN

## 2022-01-24 MED ORDER — PANTOPRAZOLE 2 MG/ML SUSPENSION
40.0000 mg | Freq: Every day | ORAL | Status: DC
  Administered 2022-01-25 – 2022-01-29 (×5): 40 mg
  Filled 2022-01-24 (×5): qty 20

## 2022-01-24 MED ORDER — INSULIN ASPART 100 UNIT/ML IJ SOLN
0.0000 [IU] | INTRAMUSCULAR | Status: DC
  Administered 2022-01-24 – 2022-01-25 (×3): 1 [IU] via SUBCUTANEOUS
  Administered 2022-01-25 (×2): 2 [IU] via SUBCUTANEOUS
  Administered 2022-01-25 – 2022-02-01 (×20): 1 [IU] via SUBCUTANEOUS

## 2022-01-24 MED ORDER — SODIUM CHLORIDE 0.9 % IV SOLN
2.0000 g | Freq: Three times a day (TID) | INTRAVENOUS | Status: DC
  Administered 2022-01-24 – 2022-01-26 (×7): 2 g via INTRAVENOUS
  Filled 2022-01-24 (×6): qty 12.5

## 2022-01-24 MED ORDER — ACETAMINOPHEN 500 MG PO TABS
1000.0000 mg | ORAL_TABLET | Freq: Four times a day (QID) | ORAL | Status: DC
  Administered 2022-01-24 – 2022-02-02 (×33): 1000 mg
  Filled 2022-01-24 (×35): qty 2
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
## 2022-01-25 ENCOUNTER — Inpatient Hospital Stay (HOSPITAL_COMMUNITY): Payer: Medicaid Other

## 2022-01-25 LAB — TRIGLYCERIDES: Triglycerides: 172 mg/dL — ABNORMAL HIGH (ref <150)

## 2022-01-25 LAB — BASIC METABOLIC PANEL
Anion gap: 5 (ref 5–15)
BUN: 9 mg/dL (ref 6–20)
CO2: 26 mmol/L (ref 22–32)
Calcium: 7.9 mg/dL — ABNORMAL LOW (ref 8.9–10.3)
Chloride: 110 mmol/L (ref 98–111)
Creatinine, Ser: 0.94 mg/dL (ref 0.61–1.24)
GFR, Estimated: >60 mL/min (ref >60)
Glucose, Bld: 117 mg/dL — ABNORMAL HIGH (ref 70–99)
Potassium: 3.7 mmol/L (ref 3.5–5.1)
Sodium: 141 mmol/L (ref 135–145)

## 2022-01-25 LAB — GLUCOSE, CAPILLARY
Glucose-Capillary: 110 mg/dL — ABNORMAL HIGH (ref 70–99)
Glucose-Capillary: 115 mg/dL — ABNORMAL HIGH (ref 70–99)
Glucose-Capillary: 123 mg/dL — ABNORMAL HIGH (ref 70–99)
Glucose-Capillary: 148 mg/dL — ABNORMAL HIGH (ref 70–99)
Glucose-Capillary: 152 mg/dL — ABNORMAL HIGH (ref 70–99)
Glucose-Capillary: 155 mg/dL — ABNORMAL HIGH (ref 70–99)

## 2022-01-25 LAB — CBC
HCT: 35 % — ABNORMAL LOW (ref 39.0–52.0)
Hemoglobin: 12 g/dL — ABNORMAL LOW (ref 13.0–17.0)
MCH: 33 pg (ref 26.0–34.0)
MCHC: 34.3 g/dL (ref 30.0–36.0)
MCV: 96.2 fL (ref 80.0–100.0)
Platelets: 159 10*3/uL (ref 150–400)
RBC: 3.64 MIL/uL — ABNORMAL LOW (ref 4.22–5.81)
RDW: 11.9 % (ref 11.5–15.5)
WBC: 12.1 10*3/uL — ABNORMAL HIGH (ref 4.0–10.5)
nRBC: 0 % (ref 0.0–0.2)

## 2022-01-25 LAB — MAGNESIUM: Magnesium: 2.1 mg/dL (ref 1.7–2.4)

## 2022-01-25 LAB — PHOSPHORUS: Phosphorus: 2.2 mg/dL — ABNORMAL LOW (ref 2.5–4.6)

## 2022-01-25 MED ORDER — POTASSIUM & SODIUM PHOSPHATES 280-160-250 MG PO PACK
1.0000 | PACK | Freq: Three times a day (TID) | ORAL | Status: AC
  Administered 2022-01-25 (×3): 1
  Filled 2022-01-25 (×2): qty 1

## 2022-01-25 MED ORDER — POTASSIUM & SODIUM PHOSPHATES 280-160-250 MG PO PACK
1.0000 | PACK | Freq: Three times a day (TID) | ORAL | Status: DC
  Filled 2022-01-25: qty 1

## 2022-01-25 MED ORDER — ENOXAPARIN SODIUM 30 MG/0.3ML IJ SOSY
30.0000 mg | PREFILLED_SYRINGE | Freq: Two times a day (BID) | INTRAMUSCULAR | Status: DC
  Administered 2022-01-25 – 2022-02-03 (×20): 30 mg via SUBCUTANEOUS
  Filled 2022-01-25 (×22): qty 0.3

## 2022-01-25 MED ORDER — SENNA 8.6 MG PO TABS
2.0000 | ORAL_TABLET | Freq: Every day | ORAL | Status: DC
  Administered 2022-01-25 – 2022-01-31 (×4): 17.2 mg
  Filled 2022-01-25 (×5): qty 2
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
## 2022-01-25 NOTE — Progress Notes (Signed)
RT attempted CPT through the bed, but pt became very agitated. CPT was stopped and pt calmed back down. ?

## 2022-01-25 NOTE — TOC Progression Note (Signed)
Transition of Care (TOC) - Progression Note  ? ? ?Patient Details  ?Name: [name] ?MRN: [id number] ?Date of Birth: Jun 07, 1996 ? ?Transition of Care (TOC) CM/SW Contact  ?Glennon Mac, RN ?Phone Number: ?01/25/2022, 2:23 PM ? ?Clinical Narrative:    ?Letter prepared requesting assistance for patient's mother to obtain emergency VISA.   ?Emailed to [email or web address] and [email or web address], per their request.  ? ? ?Expected Discharge Plan: IP Rehab Facility ?Barriers to Discharge: Continued Medical Work up ? ?Expected Discharge Plan and Services ?Expected Discharge Plan: IP Rehab Facility ?  ?Discharge Planning Services: CM Consult ?  ?Living arrangements for the past 2 months: Apartment ?                ?  ?  ?  ?  ?  ?  ?  ?  ?  ?  ? ? ?Social Determinants of Health (SDOH) Interventions ?  ? ?Readmission Risk Interventions ?   ? View : No data to display.  ?  ?  ?  ? ?Quintella Baton, RN, BSN  ?Trauma/Neuro ICU Case Manager ?[phone] ? ? ?
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
## 2022-01-25 NOTE — Progress Notes (Signed)
Chest pt not done due to patient getting agitated. ?

## 2022-01-25 NOTE — Progress Notes (Signed)
ETT holder changed. 

## 2022-01-25 NOTE — Progress Notes (Signed)
ETT retraced 2 cm per MD order. ETT now placed 23 @ lip. ?

## 2022-01-25 NOTE — Progress Notes (Signed)
Trauma Event Note ? ? ?TRN at bedside to round. Patient intubated/sedated with fentanyl. No response to painful stimuli on my assessment. Family at bedside. VSS, no needs for TRN at this time. ? ?Last imported Vital Signs ?BP 112/68   Pulse 87   Temp 98.4 ?F (36.9 ?C) (Axillary)   Resp 20   Ht 5\' 9"  (1.753 m)   Wt 206 lb 9.1 oz (93.7 kg)   SpO2 92%   BMI 30.51 kg/m?  ? ?Trending CBC ?Recent Labs  ?  01/22/22 ?0256 01/24/22 ?0600  ?WBC 16.4* 16.3*  ?HGB 13.9 12.7*  ?HCT 40.4 37.7*  ?PLT 222 184  ? ? ?Trending Coag's ?No results for input(s): APTT, INR in the last 72 hours. ? ?Trending BMET ?Recent Labs  ?  01/22/22 ?0256 01/23/22 ?1005 01/24/22 ?0600  ?NA 139 143 136  ?K 3.2* 3.2* 3.6  ?CL 110 116* 109  ?CO2 21* 22 20*  ?BUN 9 11 10   ?CREATININE 0.93 1.03 0.91  ?GLUCOSE 122* 96 121*  ? ? ? ? ?Rondo Spittler O Gursimran Litaker  ?Trauma Response RN ? ?Please call TRN at [phone] for further assistance. ? ? ?  ?

## 2022-01-26 ENCOUNTER — Inpatient Hospital Stay (HOSPITAL_COMMUNITY): Payer: Medicaid Other

## 2022-01-26 LAB — CBC
HCT: 33.9 % — ABNORMAL LOW (ref 39.0–52.0)
Hemoglobin: 11.2 g/dL — ABNORMAL LOW (ref 13.0–17.0)
MCH: 32.2 pg (ref 26.0–34.0)
MCHC: 33 g/dL (ref 30.0–36.0)
MCV: 97.4 fL (ref 80.0–100.0)
Platelets: 189 10*3/uL (ref 150–400)
RBC: 3.48 MIL/uL — ABNORMAL LOW (ref 4.22–5.81)
RDW: 12.1 % (ref 11.5–15.5)
WBC: 10.5 10*3/uL (ref 4.0–10.5)
nRBC: 0 % (ref 0.0–0.2)

## 2022-01-26 LAB — BASIC METABOLIC PANEL
Anion gap: 6 (ref 5–15)
BUN: 9 mg/dL (ref 6–20)
CO2: 26 mmol/L (ref 22–32)
Calcium: 8 mg/dL — ABNORMAL LOW (ref 8.9–10.3)
Chloride: 109 mmol/L (ref 98–111)
Creatinine, Ser: 0.75 mg/dL (ref 0.61–1.24)
GFR, Estimated: >60 mL/min (ref >60)
Glucose, Bld: 129 mg/dL — ABNORMAL HIGH (ref 70–99)
Potassium: 3.4 mmol/L — ABNORMAL LOW (ref 3.5–5.1)
Sodium: 141 mmol/L (ref 135–145)

## 2022-01-26 LAB — POCT I-STAT 7, (LYTES, BLD GAS, ICA,H+H)
Acid-Base Excess: 1 mmol/L (ref 0.0–2.0)
Bicarbonate: 25.9 mmol/L (ref 20.0–28.0)
Calcium, Ion: 1.14 mmol/L — ABNORMAL LOW (ref 1.15–1.40)
HCT: 31 % — ABNORMAL LOW (ref 39.0–52.0)
Hemoglobin: 10.5 g/dL — ABNORMAL LOW (ref 13.0–17.0)
O2 Saturation: 93 %
Patient temperature: 98
Potassium: 3.4 mmol/L — ABNORMAL LOW (ref 3.5–5.1)
Sodium: 141 mmol/L (ref 135–145)
TCO2: 27 mmol/L (ref 22–32)
pCO2 arterial: 39.7 mmHg (ref 32–48)
pH, Arterial: 7.42 (ref 7.35–7.45)
pO2, Arterial: 65 mmHg — ABNORMAL LOW (ref 83–108)

## 2022-01-26 LAB — GLUCOSE, CAPILLARY
Glucose-Capillary: 122 mg/dL — ABNORMAL HIGH (ref 70–99)
Glucose-Capillary: 104 mg/dL — ABNORMAL HIGH (ref 70–99)
Glucose-Capillary: 132 mg/dL — ABNORMAL HIGH (ref 70–99)
Glucose-Capillary: 117 mg/dL — ABNORMAL HIGH (ref 70–99)
Glucose-Capillary: 127 mg/dL — ABNORMAL HIGH (ref 70–99)
Glucose-Capillary: 148 mg/dL — ABNORMAL HIGH (ref 70–99)

## 2022-01-26 LAB — CULTURE, RESPIRATORY W GRAM STAIN

## 2022-01-26 LAB — PHOSPHORUS: Phosphorus: 2.9 mg/dL (ref 2.5–4.6)

## 2022-01-26 MED ORDER — BISACODYL 5 MG PO TBEC
10.0000 mg | DELAYED_RELEASE_TABLET | Freq: Every day | ORAL | Status: DC
  Administered 2022-01-26: 10 mg via ORAL
  Filled 2022-01-26: qty 2

## 2022-01-26 MED ORDER — LACTULOSE 10 GM/15ML PO SOLN
20.0000 g | Freq: Three times a day (TID) | ORAL | Status: DC
  Administered 2022-01-26 (×3): 20 g
  Filled 2022-01-26 (×3): qty 30

## 2022-01-26 MED ORDER — POTASSIUM CHLORIDE 20 MEQ PO PACK
40.0000 meq | PACK | Freq: Two times a day (BID) | ORAL | Status: AC
  Administered 2022-01-26 (×2): 40 meq
  Filled 2022-01-26 (×2): qty 2

## 2022-01-26 MED ORDER — BISACODYL 10 MG RE SUPP
10.0000 mg | Freq: Once | RECTAL | Status: AC
  Administered 2022-01-26: 10 mg via RECTAL
  Filled 2022-01-26: qty 1

## 2022-01-26 MED ORDER — CEFAZOLIN SODIUM-DEXTROSE 2-4 GM/100ML-% IV SOLN
2.0000 g | Freq: Three times a day (TID) | INTRAVENOUS | Status: AC
  Administered 2022-01-26 – 2022-01-30 (×13): 2 g via INTRAVENOUS
  Filled 2022-01-26 (×13): qty 100

## 2022-01-26 MED ORDER — POLYETHYLENE GLYCOL 3350 17 G PO PACK
17.0000 g | PACK | Freq: Two times a day (BID) | ORAL | Status: DC
  Administered 2022-01-26 – 2022-01-31 (×6): 17 g
  Filled 2022-01-26 (×7): qty 1
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
## 2022-01-26 NOTE — Progress Notes (Signed)
Chest pt help at this time due to patient being agitated.  RN aware. ?
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
## 2022-01-26 NOTE — Progress Notes (Signed)
RT unable to do CPT at this time due to agitation. RT will try again at next scheduled time. ?
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
## 2022-01-27 LAB — BASIC METABOLIC PANEL
Anion gap: 6 (ref 5–15)
BUN: 10 mg/dL (ref 6–20)
CO2: 26 mmol/L (ref 22–32)
Calcium: 8.3 mg/dL — ABNORMAL LOW (ref 8.9–10.3)
Chloride: 106 mmol/L (ref 98–111)
Creatinine, Ser: 0.76 mg/dL (ref 0.61–1.24)
GFR, Estimated: >60 mL/min (ref >60)
Glucose, Bld: 120 mg/dL — ABNORMAL HIGH (ref 70–99)
Potassium: 4.1 mmol/L (ref 3.5–5.1)
Sodium: 138 mmol/L (ref 135–145)

## 2022-01-27 LAB — CBC
HCT: 35.3 % — ABNORMAL LOW (ref 39.0–52.0)
Hemoglobin: 11.8 g/dL — ABNORMAL LOW (ref 13.0–17.0)
MCH: 32.4 pg (ref 26.0–34.0)
MCHC: 33.4 g/dL (ref 30.0–36.0)
MCV: 97 fL (ref 80.0–100.0)
Platelets: 190 10*3/uL (ref 150–400)
RBC: 3.64 MIL/uL — ABNORMAL LOW (ref 4.22–5.81)
RDW: 12.1 % (ref 11.5–15.5)
WBC: 13.2 10*3/uL — ABNORMAL HIGH (ref 4.0–10.5)
nRBC: 0 % (ref 0.0–0.2)

## 2022-01-27 LAB — GLUCOSE, CAPILLARY
Glucose-Capillary: 113 mg/dL — ABNORMAL HIGH (ref 70–99)
Glucose-Capillary: 116 mg/dL — ABNORMAL HIGH (ref 70–99)
Glucose-Capillary: 112 mg/dL — ABNORMAL HIGH (ref 70–99)
Glucose-Capillary: 132 mg/dL — ABNORMAL HIGH (ref 70–99)
Glucose-Capillary: 126 mg/dL — ABNORMAL HIGH (ref 70–99)
Glucose-Capillary: 146 mg/dL — ABNORMAL HIGH (ref 70–99)

## 2022-01-27 NOTE — TOC Progression Note (Signed)
Transition of Care (TOC) - Progression Note  ? ? ?Patient Details  ?Name: James Howell ?MRN: [id number] ?Date of Birth: [birth year] ? ?Transition of Care (TOC) CM/SW Contact  ?Ella Bodo, RN ?Phone Number: ?01/27/2022, 3:28 PM ? ?Clinical Narrative:    ?Letter prepared and emailed to patient's sister James Howell to take to the [employer], in an attempt to obtain emergency VISA.   ?Emailed to [email or web address], per request.  ? ?Expected Discharge Plan: [facility] ?Barriers to Discharge: Continued Medical Work up ? ?Expected Discharge Plan and Services ?Expected Discharge Plan: [place] ?  ?Discharge Planning Services: CM Consult ?  ?Living arrangements for the past 2 months: Apartment ?                ?  ?  ?  ?  ?  ?  ?  ?  ?  ?  ? ? ?Social Determinants of Health (SDOH) Interventions ?  ? ?Readmission Risk Interventions ?   ? View : No data to display.  ?  ?  ?  ? ?[address], RN, BSN  ?Trauma/Neuro ICU Case Manager ?[phone] ? ?
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
## 2022-01-28 ENCOUNTER — Inpatient Hospital Stay (HOSPITAL_COMMUNITY): Payer: Medicaid Other

## 2022-01-28 LAB — GLUCOSE, CAPILLARY
Glucose-Capillary: 106 mg/dL — ABNORMAL HIGH (ref 70–99)
Glucose-Capillary: 129 mg/dL — ABNORMAL HIGH (ref 70–99)
Glucose-Capillary: 117 mg/dL — ABNORMAL HIGH (ref 70–99)
Glucose-Capillary: 122 mg/dL — ABNORMAL HIGH (ref 70–99)
Glucose-Capillary: 131 mg/dL — ABNORMAL HIGH (ref 70–99)

## 2022-01-28 LAB — TRIGLYCERIDES: Triglycerides: 93 mg/dL (ref <150)
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
## 2022-01-29 LAB — CBC
HCT: 34.3 % — ABNORMAL LOW (ref 39.0–52.0)
Hemoglobin: 11.4 g/dL — ABNORMAL LOW (ref 13.0–17.0)
MCH: 33 pg (ref 26.0–34.0)
MCHC: 33.2 g/dL (ref 30.0–36.0)
MCV: 99.4 fL (ref 80.0–100.0)
Platelets: 250 10*3/uL (ref 150–400)
RBC: 3.45 MIL/uL — ABNORMAL LOW (ref 4.22–5.81)
RDW: 12.5 % (ref 11.5–15.5)
WBC: 9.2 10*3/uL (ref 4.0–10.5)
nRBC: 0 % (ref 0.0–0.2)

## 2022-01-29 LAB — GLUCOSE, CAPILLARY
Glucose-Capillary: 111 mg/dL — ABNORMAL HIGH (ref 70–99)
Glucose-Capillary: 113 mg/dL — ABNORMAL HIGH (ref 70–99)
Glucose-Capillary: 116 mg/dL — ABNORMAL HIGH (ref 70–99)
Glucose-Capillary: 116 mg/dL — ABNORMAL HIGH (ref 70–99)
Glucose-Capillary: 117 mg/dL — ABNORMAL HIGH (ref 70–99)
Glucose-Capillary: 133 mg/dL — ABNORMAL HIGH (ref 70–99)
Glucose-Capillary: 139 mg/dL — ABNORMAL HIGH (ref 70–99)

## 2022-01-29 LAB — BASIC METABOLIC PANEL
Anion gap: 7 (ref 5–15)
BUN: 14 mg/dL (ref 6–20)
CO2: 27 mmol/L (ref 22–32)
Calcium: 8.5 mg/dL — ABNORMAL LOW (ref 8.9–10.3)
Chloride: 104 mmol/L (ref 98–111)
Creatinine, Ser: 0.74 mg/dL (ref 0.61–1.24)
GFR, Estimated: >60 mL/min (ref >60)
Glucose, Bld: 145 mg/dL — ABNORMAL HIGH (ref 70–99)
Potassium: 3.5 mmol/L (ref 3.5–5.1)
Sodium: 138 mmol/L (ref 135–145)

## 2022-01-29 MED ORDER — PROPOFOL 1000 MG/100ML IV EMUL
0.0000 ug/kg/min | INTRAVENOUS | Status: DC
  Administered 2022-01-29: 30 ug/kg/min via INTRAVENOUS
  Administered 2022-01-29 (×3): 50 ug/kg/min via INTRAVENOUS
  Filled 2022-01-29 (×4): qty 100
  Filled 2022-01-29: qty 200

## 2022-01-29 MED ORDER — DEXMEDETOMIDINE HCL IN NACL 400 MCG/100ML IV SOLN
0.0000 ug/kg/h | INTRAVENOUS | Status: AC
  Administered 2022-01-29: 0.6 ug/kg/h via INTRAVENOUS
  Administered 2022-01-29: 0.4 ug/kg/h via INTRAVENOUS
  Administered 2022-01-30 (×2): 1.2 ug/kg/h via INTRAVENOUS
  Administered 2022-01-30: 0.9 ug/kg/h via INTRAVENOUS
  Administered 2022-01-30: 1 ug/kg/h via INTRAVENOUS
  Administered 2022-01-30 – 2022-01-31 (×3): 1.2 ug/kg/h via INTRAVENOUS
  Administered 2022-01-31: 0.5 ug/kg/h via INTRAVENOUS
  Filled 2022-01-29 (×10): qty 100
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
## 2022-01-30 ENCOUNTER — Inpatient Hospital Stay (HOSPITAL_COMMUNITY): Payer: Medicaid Other

## 2022-01-30 ENCOUNTER — Encounter (HOSPITAL_COMMUNITY): Payer: Self-pay

## 2022-01-30 ENCOUNTER — Other Ambulatory Visit: Payer: Self-pay

## 2022-01-30 LAB — BASIC METABOLIC PANEL
Anion gap: 6 (ref 5–15)
BUN: 15 mg/dL (ref 6–20)
CO2: 30 mmol/L (ref 22–32)
Calcium: 8.5 mg/dL — ABNORMAL LOW (ref 8.9–10.3)
Chloride: 102 mmol/L (ref 98–111)
Creatinine, Ser: 0.84 mg/dL (ref 0.61–1.24)
GFR, Estimated: >60 mL/min (ref >60)
Glucose, Bld: 122 mg/dL — ABNORMAL HIGH (ref 70–99)
Potassium: 3.9 mmol/L (ref 3.5–5.1)
Sodium: 138 mmol/L (ref 135–145)

## 2022-01-30 LAB — GLUCOSE, CAPILLARY
Glucose-Capillary: 133 mg/dL — ABNORMAL HIGH (ref 70–99)
Glucose-Capillary: 115 mg/dL — ABNORMAL HIGH (ref 70–99)
Glucose-Capillary: 104 mg/dL — ABNORMAL HIGH (ref 70–99)
Glucose-Capillary: 119 mg/dL — ABNORMAL HIGH (ref 70–99)
Glucose-Capillary: 133 mg/dL — ABNORMAL HIGH (ref 70–99)
Glucose-Capillary: 119 mg/dL — ABNORMAL HIGH (ref 70–99)

## 2022-01-30 LAB — CBC
HCT: 32.1 % — ABNORMAL LOW (ref 39.0–52.0)
Hemoglobin: 10.5 g/dL — ABNORMAL LOW (ref 13.0–17.0)
MCH: 32.4 pg (ref 26.0–34.0)
MCHC: 32.7 g/dL (ref 30.0–36.0)
MCV: 99.1 fL (ref 80.0–100.0)
Platelets: 261 10*3/uL (ref 150–400)
RBC: 3.24 MIL/uL — ABNORMAL LOW (ref 4.22–5.81)
RDW: 12.1 % (ref 11.5–15.5)
WBC: 9.2 10*3/uL (ref 4.0–10.5)
nRBC: 0 % (ref 0.0–0.2)

## 2022-01-30 MED ORDER — HALOPERIDOL LACTATE 5 MG/ML IJ SOLN: 10.0000 mg | Freq: Four times a day (QID) | INTRAMUSCULAR | Status: DC | PRN

## 2022-01-30 MED ORDER — CLONAZEPAM 0.25 MG PO TBDP
0.5000 mg | ORAL_TABLET | Freq: Two times a day (BID) | ORAL | Status: DC
  Administered 2022-01-30 – 2022-02-02 (×7): 0.5 mg
  Filled 2022-01-30 (×7): qty 2

## 2022-01-30 MED ORDER — QUETIAPINE FUMARATE 200 MG PO TABS
200.0000 mg | ORAL_TABLET | Freq: Two times a day (BID) | ORAL | Status: DC
  Administered 2022-01-30 – 2022-02-02 (×5): 200 mg
  Filled 2022-01-30 (×6): qty 1

## 2022-01-30 MED ORDER — CLONAZEPAM 0.25 MG PO TBDP: 0.5000 mg | ORAL_TABLET | Freq: Two times a day (BID) | ORAL | Status: DC

## 2022-01-30 MED ORDER — HALOPERIDOL LACTATE 5 MG/ML IJ SOLN
10.0000 mg | Freq: Four times a day (QID) | INTRAMUSCULAR | Status: DC | PRN
  Administered 2022-01-30: 10 mg via INTRAVENOUS
  Administered 2022-01-31: 5 mg via INTRAVENOUS
  Filled 2022-01-30 (×3): qty 2

## 2022-01-30 NOTE — Procedures (Signed)
Extubation Procedure Note ? ?Patient Details:   ?Name: [facility] ?DOB: [birth year] ?MRN: [id number] ?  ?Airway Documentation:  ?  ?Vent end date: 01/30/22 Vent end time: 0843  ? ?Evaluation ? O2 sats: stable throughout ?Complications: No apparent complications ?Patient did tolerate procedure well. ?Bilateral Breath Sounds: Clear, Diminished ?  ?Yes ? ?Patient extubated per order to 4L [place] with no apparent complications. Positive cuff leak was noted prior to extubation. Patient is alert, has strong cough, and is able to weakly speak. Vitals are stable. RT will continue to monitor.  ? ?Collin Hendley Clyda Greener ?01/30/2022, 8:45 AM ? ?

## 2022-01-30 NOTE — Procedures (Signed)
Cortrak  Person Inserting Tube:  Yahel Fuston C, RD Tube Type:  Cortrak - 43 inches Tube Size:  10 Tube Location:  Left nare Secured by: Bridle Technique Used to Measure Tube Placement:  Marking at nare/corner of mouth Cortrak Secured At:  70 cm   Cortrak Tube Team Note:  Consult received to place a Cortrak feeding tube.   X-ray is required, abdominal x-ray has been ordered by the Cortrak team. Please confirm tube placement before using the Cortrak tube.   If the tube becomes dislodged please keep the tube and contact the Cortrak team at [email or web address] ([email or web address]) for replacement.  If after hours and replacement cannot be delayed, place a NG tube and confirm placement with an abdominal x-ray.    Albert Devaul P., RD, LDN, CNSC See AMiON for contact information    
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
## 2022-01-30 NOTE — Plan of Care (Signed)
?  Problem: Safety: Goal: Non-violent Restraint(s) Outcome: Progressing   Problem: Education: Goal: Knowledge of General Education information will improve Description: Including pain rating scale, medication(s)/side effects and non-pharmacologic comfort measures Outcome: Progressing   Problem: Health Behavior/Discharge Planning: Goal: Ability to manage health-related needs will improve Outcome: Progressing   Problem: Clinical Measurements: Goal: Ability to maintain clinical measurements within normal limits will improve Outcome: Progressing   Problem: Nutrition: Goal: Adequate nutrition will be maintained Outcome: Progressing   Problem: Pain Managment: Goal: General experience of comfort will improve Outcome: Progressing   Problem: Safety: Goal: Ability to remain free from injury will improve Outcome: Progressing   Problem: Skin Integrity: Goal: Risk for impaired skin integrity will decrease Outcome: Progressing   

## 2022-01-30 NOTE — Progress Notes (Signed)
SLP Cancellation Note ? ?Patient Details ?Name: [name] ?MRN: [id number] ?DOB: May 29, 1996 ? ? ?Cancelled treatment:       Reason Eval/Treat Not Completed: Other (comment). Pt extubated this am, has a cortrak in place and is currently resting quietly. Will f/u tomorrow for SLP evaluations.  ? ? ?Lewanna Petrak, Riley Nearing ?01/30/2022, 12:07 PM ?

## 2022-01-31 LAB — GLUCOSE, CAPILLARY
Glucose-Capillary: 102 mg/dL — ABNORMAL HIGH (ref 70–99)
Glucose-Capillary: 106 mg/dL — ABNORMAL HIGH (ref 70–99)
Glucose-Capillary: 110 mg/dL — ABNORMAL HIGH (ref 70–99)
Glucose-Capillary: 113 mg/dL — ABNORMAL HIGH (ref 70–99)
Glucose-Capillary: 125 mg/dL — ABNORMAL HIGH (ref 70–99)
Glucose-Capillary: 143 mg/dL — ABNORMAL HIGH (ref 70–99)

## 2022-01-31 LAB — CBC
HCT: 31.1 % — ABNORMAL LOW (ref 39.0–52.0)
Hemoglobin: 10.6 g/dL — ABNORMAL LOW (ref 13.0–17.0)
MCH: 33.2 pg (ref 26.0–34.0)
MCHC: 34.1 g/dL (ref 30.0–36.0)
MCV: 97.5 fL (ref 80.0–100.0)
Platelets: 282 10*3/uL (ref 150–400)
RBC: 3.19 MIL/uL — ABNORMAL LOW (ref 4.22–5.81)
RDW: 12.1 % (ref 11.5–15.5)
WBC: 11.1 10*3/uL — ABNORMAL HIGH (ref 4.0–10.5)
nRBC: 0 % (ref 0.0–0.2)

## 2022-01-31 LAB — BASIC METABOLIC PANEL
Anion gap: 6 (ref 5–15)
BUN: 19 mg/dL (ref 6–20)
CO2: 27 mmol/L (ref 22–32)
Calcium: 8.5 mg/dL — ABNORMAL LOW (ref 8.9–10.3)
Chloride: 106 mmol/L (ref 98–111)
Creatinine, Ser: 0.73 mg/dL (ref 0.61–1.24)
GFR, Estimated: >60 mL/min (ref >60)
Glucose, Bld: 135 mg/dL — ABNORMAL HIGH (ref 70–99)
Potassium: 3.6 mmol/L (ref 3.5–5.1)
Sodium: 139 mmol/L (ref 135–145)

## 2022-01-31 MED ORDER — POTASSIUM CHLORIDE 20 MEQ PO PACK
40.0000 meq | PACK | Freq: Once | ORAL | Status: AC
Start: 2022-01-31 — End: 2022-01-31
  Administered 2022-01-31: 40 meq
  Filled 2022-01-31: qty 2

## 2022-01-31 MED ORDER — DEXMEDETOMIDINE HCL IN NACL 400 MCG/100ML IV SOLN
0.4000 ug/kg/h | INTRAVENOUS | Status: DC
  Administered 2022-01-31: 0.5 ug/kg/h via INTRAVENOUS
  Administered 2022-02-01: 0.4 ug/kg/h via INTRAVENOUS
  Filled 2022-01-31 (×2): qty 100
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
## 2022-01-31 NOTE — Progress Notes (Addendum)
Patient tolerating extubation well.  Nonfocal exam.  Continue rehab efforts and mobilization.  No new   Recommendations ?
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
## 2022-01-31 NOTE — Progress Notes (Signed)
Inpatient Rehab Admissions Coordinator:   Per therapy recommendations, patient was screened for CIR candidacy by Tiauna Whisnant, MS, CCC-SLP . At this time, Pt. is not yet at a level to tolerate intensity of CIR; however,   Pt. may have potential to progress to becoming a potential CIR candidate, so CIR admissions team will follow and monitor for progress and participation with therapies and place consult order if Pt. appears to be an appropriate candidate. Please contact me with any questions.   Nayab Aten, MS, CCC-SLP Rehab Admissions Coordinator  [phone] (celll) [phone] (office)   
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
## 2022-02-01 ENCOUNTER — Inpatient Hospital Stay (HOSPITAL_COMMUNITY): Payer: Medicaid Other

## 2022-02-01 LAB — BASIC METABOLIC PANEL
Anion gap: 8 (ref 5–15)
BUN: 20 mg/dL (ref 6–20)
CO2: 26 mmol/L (ref 22–32)
Calcium: 8.9 mg/dL (ref 8.9–10.3)
Chloride: 105 mmol/L (ref 98–111)
Creatinine, Ser: 0.8 mg/dL (ref 0.61–1.24)
GFR, Estimated: >60 mL/min (ref >60)
Glucose, Bld: 114 mg/dL — ABNORMAL HIGH (ref 70–99)
Potassium: 3.8 mmol/L (ref 3.5–5.1)
Sodium: 139 mmol/L (ref 135–145)

## 2022-02-01 LAB — CBC
HCT: 33.1 % — ABNORMAL LOW (ref 39.0–52.0)
Hemoglobin: 11 g/dL — ABNORMAL LOW (ref 13.0–17.0)
MCH: 32.1 pg (ref 26.0–34.0)
MCHC: 33.2 g/dL (ref 30.0–36.0)
MCV: 96.5 fL (ref 80.0–100.0)
Platelets: 345 10*3/uL (ref 150–400)
RBC: 3.43 MIL/uL — ABNORMAL LOW (ref 4.22–5.81)
RDW: 12.2 % (ref 11.5–15.5)
WBC: 11.9 10*3/uL — ABNORMAL HIGH (ref 4.0–10.5)
nRBC: 0 % (ref 0.0–0.2)

## 2022-02-01 LAB — GLUCOSE, CAPILLARY
Glucose-Capillary: 116 mg/dL — ABNORMAL HIGH (ref 70–99)
Glucose-Capillary: 88 mg/dL (ref 70–99)
Glucose-Capillary: 90 mg/dL (ref 70–99)
Glucose-Capillary: 93 mg/dL (ref 70–99)
Glucose-Capillary: 122 mg/dL — ABNORMAL HIGH (ref 70–99)

## 2022-02-01 MED ORDER — POTASSIUM CHLORIDE 20 MEQ PO PACK
40.0000 meq | PACK | Freq: Once | ORAL | Status: AC
Start: 2022-02-01 — End: 2022-02-01
  Administered 2022-02-01: 40 meq
  Filled 2022-02-01: qty 2

## 2022-02-01 MED ORDER — POLYETHYLENE GLYCOL 3350 17 G PO PACK: 17.0000 g | PACK | Freq: Every day | ORAL | Status: DC

## 2022-02-01 NOTE — Progress Notes (Signed)
?  Progress Note  ? ?Date: 01/30/2022 ? ?Patient Name: [facility]        ?MRN#: [id number] ? ? ?Clarification of diagnosis: ? ? ?Pneumonia due to Klebsiella pneumoniae ? ? ? ? ?
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
## 2022-02-01 NOTE — Progress Notes (Signed)
Patient ID: James Howell, male   DOB: 09-06-1996, 26 y.o.   MRN: [id number] ?Follow up - Trauma Critical Care ?  ?Patient Details:  ?  ?[facility] James Howell is an 26 y.o. male. ? ?Lines/tubes ?: ?External Urinary Catheter (Active)  ?Collection Container Standard drainage bag 01/31/22 0800  ?Suction (Verified suction is between 40-80 mmHg) N/A (Patient has condom catheter) 01/30/22 2000  ?Securement Method Securing device (Describe) 01/31/22 0800  ?Site Assessment Clean, Dry, Intact 01/31/22 0800  ?Intervention Male External Urinary Catheter Replaced 01/31/22 0800  ?Output (mL) 250 mL 01/31/22 1900  ? ? ?Microbiology/Sepsis markers: ?Results for orders placed or performed during the hospital encounter of 01/21/22  ?Resp Panel by RT-PCR (Flu A&B, Covid) Nasopharyngeal Swab     Status: None  ? Collection Time: 01/21/22  3:41 PM  ? Specimen: Nasopharyngeal Swab; Nasopharyngeal(NP) swabs in vial transport medium  ?Result Value Ref Range Status  ? SARS Coronavirus 2 by RT PCR NEGATIVE NEGATIVE Final  ?  Comment: (NOTE) ?SARS-CoV-2 target nucleic acids are NOT DETECTED. ? ?The SARS-CoV-2 RNA is generally detectable in upper respiratory ?specimens during the acute phase of infection. The lowest ?concentration of SARS-CoV-2 viral copies this assay can detect is ?138 copies/mL. A negative result does not preclude SARS-Cov-2 ?infection and should not be used as the sole basis for treatment or ?other patient management decisions. A negative result may occur with  ?improper specimen collection/handling, submission of specimen other ?than nasopharyngeal swab, presence of viral mutation(s) within the ?areas targeted by this assay, and inadequate number of viral ?copies(<138 copies/mL). A negative result must be combined with ?clinical observations, patient history, and epidemiological ?information. The expected result is Negative. ? ?Fact Sheet for Patients:  ?BloggerCourse.com ? ?Fact Sheet for  Healthcare Providers:  ?SeriousBroker.it ? ?This test is no t yet approved or cleared by the [place] FDA and  ?has been authorized for detection and/or diagnosis of SARS-CoV-2 by ?FDA under an Emergency Use Authorization (EUA). This EUA will remain  ?in effect (meaning this test can be used) for the duration of the ?COVID-19 declaration under Section 564(b)(1) of the Act, 21 ?U.S.C.section 360bbb-3(b)(1), unless the authorization is terminated  ?or revoked sooner.  ? ? ?  ? Influenza A by PCR NEGATIVE NEGATIVE Final  ? Influenza B by PCR NEGATIVE NEGATIVE Final  ?  Comment: (NOTE) ?The Xpert Xpress SARS-CoV-2/FLU/RSV plus assay is intended as an aid ?in the diagnosis of influenza from Nasopharyngeal swab specimens and ?should not be used as a sole basis for treatment. Nasal washings and ?aspirates are unacceptable for Xpert Xpress SARS-CoV-2/FLU/RSV ?testing. ? ?Fact Sheet for Patients: ?BloggerCourse.com ? ?Fact Sheet for Healthcare Providers: ?SeriousBroker.it ? ?This test is not yet approved or cleared by the [place] FDA and ?has been authorized for detection and/or diagnosis of SARS-CoV-2 by ?FDA under an Emergency Use Authorization (EUA). This EUA will remain ?in effect (meaning this test can be used) for the duration of the ?COVID-19 declaration under Section 564(b)(1) of the Act, 21 U.S.C. ?section 360bbb-3(b)(1), unless the authorization is terminated or ?revoked. ? ?Performed at [facility], [address] [place], [place] ?[address] ?  ?MRSA Next Gen by PCR, Nasal     Status: None  ? Collection Time: 01/21/22  5:14 PM  ? Specimen: Nasal Mucosa; Nasal Swab  ?Result Value Ref Range Status  ? MRSA by PCR Next Gen NOT DETECTED NOT DETECTED Final  ?  Comment: (NOTE) ?The GeneXpert MRSA Assay (FDA approved for  NASAL specimens only), ?is one component of a comprehensive MRSA colonization surveillance ?program. It is not  intended to diagnose MRSA infection nor to guide ?or monitor treatment for MRSA infections. ?Test performance is not FDA approved in patients less than 2 years ?old. ?Performed at [facility], [address] [place], [place] ?[address] ?  ?Culture, Respiratory w Gram Stain     Status: None  ? Collection Time: 01/24/22  8:50 AM  ? Specimen: Tracheal Aspirate; Respiratory  ?Result Value Ref Range Status  ? Specimen Description TRACHEAL ASPIRATE  Final  ? Special Requests NONE  Final  ? Gram Stain   Final  ?  FEW WBC PRESENT, PREDOMINANTLY PMN ?MODERATE GRAM POSITIVE COCCI IN CHAINS ?MODERATE GRAM NEGATIVE RODS ?Performed at [facility], [address] [place], [place] [address] ?  ? Culture ABUNDANT KLEBSIELLA PNEUMONIAE  Final  ? Report Status 01/26/2022 FINAL  Final  ? Organism ID, Bacteria KLEBSIELLA PNEUMONIAE  Final  ?    Susceptibility  ? Klebsiella pneumoniae - MIC*  ?  AMPICILLIN >=32 RESISTANT Resistant   ?  CEFAZOLIN <=4 SENSITIVE Sensitive   ?  CEFEPIME <=0.12 SENSITIVE Sensitive   ?  CEFTAZIDIME <=1 SENSITIVE Sensitive   ?  CEFTRIAXONE <=0.25 SENSITIVE Sensitive   ?  CIPROFLOXACIN <=0.25 SENSITIVE Sensitive   ?  GENTAMICIN <=1 SENSITIVE Sensitive   ?  IMIPENEM <=0.25 SENSITIVE Sensitive   ?  TRIMETH/SULFA <=20 SENSITIVE Sensitive   ?  AMPICILLIN/SULBACTAM 4 SENSITIVE Sensitive   ?  PIP/TAZO <=4 SENSITIVE Sensitive   ?  * ABUNDANT KLEBSIELLA PNEUMONIAE  ? ? ?Anti-infectives:  ?Anti-infectives (From admission, onward)  ? ? Start     Dose/Rate Route Frequency Ordered Stop  ? 01/26/22 2100  ceFAZolin (ANCEF) IVPB 2g/100 mL premix       ? 2 g ?200 mL/hr over 30 Minutes Intravenous Every 8 hours 01/26/22 1340 01/30/22 2159  ? 01/24/22 1000  ceFEPIme (MAXIPIME) 2 g in sodium chloride 0.9 % 100 mL IVPB  Status:  Discontinued       ? 2 g ?200 mL/hr over 30 Minutes Intravenous Every 8 hours 01/24/22 0905 01/26/22 1340  ? 01/21/22 1445  ceFAZolin (ANCEF) IVPB 2g/100 mL premix       ? 2 g ?200 mL/hr  over 30 Minutes Intravenous  Once 01/21/22 1438 01/21/22 1543  ? ?  ? ? ?Best Practice/Protocols:  ?VTE Prophylaxis: Lovenox (prophylaxtic dose) ?Continous Sedation ? ?Consults: ?Treatment Team:  ?Md, Trauma, MD ?[name], [name], MD  ? ? ?Studies: ? ? ? ?Events: ? ?Subjective:  ?  ?Overnight Issues:  ? ?Objective:  ?Vital signs for last 24 hours: ?Temp:  [98.6 ?F (37 ?C)-102.4 ?F (39.1 ?C)] 99.8 ?F (37.7 ?C) (05/03 0800) ?Pulse Rate:  [72-106] 73 (05/03 0700) ?Resp:  [14-28] 15 (05/03 0700) ?BP: (94-156)/(60-110) 106/62 (05/03 0700) ?SpO2:  [93 %-100 %] 99 % (05/03 0700) ?Weight:  [90.2 kg] 90.2 kg (05/03 0500) ? ?Hemodynamic parameters for last 24 hours: ?  ? ?Intake/Output from previous day: ?05/02 0701 - 05/03 0700 ?In: 843.8 [I.V.:143.8; NG/GT:700] ?Out: 1050 [Urine:1050]  ?Intake/Output this shift: ?Total I/O ?In: 2.1 [I.V.:2.1] ?Out: -  ? ?Vent settings for last 24 hours: ?  ? ?Physical Exam:  ?General: alert and no respiratory distress ?Neuro: F/C well, calm ?HEENT/Neck: no JVD ?Resp: clear to auscultation bilaterally ?CVS: RRR ?GI: soft, NT ?Extremities: calves soft ? ?Results for orders placed or performed during the hospital encounter of 01/21/22 (from  the past 24 hour(s))  ?Glucose, capillary     Status: Abnormal  ? Collection Time: 01/31/22 11:26 AM  ?Result Value Ref Range  ? Glucose-Capillary 113 (H) 70 - 99 mg/dL  ?Glucose, capillary     Status: Abnormal  ? Collection Time: 01/31/22  3:35 PM  ?Result Value Ref Range  ? Glucose-Capillary 110 (H) 70 - 99 mg/dL  ?Glucose, capillary     Status: Abnormal  ? Collection Time: 01/31/22  7:33 PM  ?Result Value Ref Range  ? Glucose-Capillary 102 (H) 70 - 99 mg/dL  ?Glucose, capillary     Status: Abnormal  ? Collection Time: 01/31/22 11:49 PM  ?Result Value Ref Range  ? Glucose-Capillary 106 (H) 70 - 99 mg/dL  ?Glucose, capillary     Status: Abnormal  ? Collection Time: 02/01/22  3:40 AM  ?Result Value Ref Range  ? Glucose-Capillary 116 (H) 70 - 99 mg/dL  ?Basic  metabolic panel     Status: Abnormal  ? Collection Time: 02/01/22  5:05 AM  ?Result Value Ref Range  ? Sodium 139 135 - 145 mmol/L  ? Potassium 3.8 3.5 - 5.1 mmol/L  ? Chloride 105 98 - 111 mmol/L  ? CO2 26 2
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
## 2022-02-01 NOTE — Progress Notes (Signed)
Overall stable.  No new issues or problems.  Continue supportive care.  No new recommendations from our standpoint. ?

## 2022-02-01 NOTE — TOC Progression Note (Signed)
Transition of Care (TOC) - Progression Note  ? ? ?Patient Details  ?Name: James Howell ?MRN: [id number] ?Date of Birth: 10-02-1996 ? ?Transition of Care (TOC) CM/SW Contact  ?James Mac, RN ?Phone Number: ?02/01/2022, 3:08 PM ? ?Clinical Narrative:    ?Patient continues to make good progress with therapies, and CIR is recommended.  Rehab consult has been ordered; will continue to follow as pt progresses.  ? ? ?Expected Discharge Plan: IP Rehab Facility ?Barriers to Discharge: Continued Medical Work up ? ?Expected Discharge Plan and Services ?Expected Discharge Plan: IP Rehab Facility ?  ?Discharge Planning Services: CM Consult ?  ?Living arrangements for the past 2 months: Apartment ?                ?  ?  ?  ?  ?  ?  ?  ?  ?  ?  ? ? ?Social Determinants of Health (SDOH) Interventions ?  ? ?Readmission Risk Interventions ?   ? View : No data to display.  ?  ?  ?  ? ?James Baton, RN, BSN  ?Trauma/Neuro ICU Case Manager ?[phone] ? ?

## 2022-02-01 NOTE — Progress Notes (Signed)
Inpatient Rehab Admissions Coordinator:  ? ?Per therapy recommendations,  patient was screened for CIR candidacy by Abigayle Wilinski, MS, CCC-SLP. At this time, Pt. Appears to be a a potential candidate for CIR. I will place   order for rehab consult per protocol for full assessment. Please contact me any with questions. ? ?Fitz Matsuo, MS, CCC-SLP ?Rehab Admissions Coordinator  ?[phone] (celll) ?[phone] (office) ? ?
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
## 2022-02-01 NOTE — Procedures (Signed)
Cortrak ? ?Person Inserting Tube:  Osa Craver, RD ?Tube Type:  Cortrak - 43 inches ?Tube Size:  10 ?Tube Location:  Right nare ?Initial Placement:  Stomach ?Secured by: What Cheer Callas ?Technique Used to Measure Tube Placement:  Marking at nare/corner of mouth ?Cortrak Secured At:  60 cm ? ?Cortrak Tube Team Note: ? ?Consult received to place a Cortrak feeding tube.  ? ?NG tube removed by RN prior to placement. Pt moving a lot during placing and sneezing frequently making closure of bridle clip challenging. Bridle in ok position but tighter than usual. RN made aware and encouraged RN to monitor closely for skin breakdown. Unit RD made aware also.  ? ?X-ray is required, abdominal x-ray has been ordered by the Cortrak team. Please confirm tube placement before using the Cortrak tube.  ? ?If the tube becomes dislodged please keep the tube and contact the Cortrak team at [email or web address] (password [email or web address]) for replacement.  ?If after hours and replacement cannot be delayed, place a NG tube and confirm placement with an abdominal x-ray.  ? ? ? ?

## 2022-02-02 LAB — BASIC METABOLIC PANEL
Anion gap: 9 (ref 5–15)
BUN: 24 mg/dL — ABNORMAL HIGH (ref 6–20)
CO2: 24 mmol/L (ref 22–32)
Calcium: 8.9 mg/dL (ref 8.9–10.3)
Chloride: 107 mmol/L (ref 98–111)
Creatinine, Ser: 0.83 mg/dL (ref 0.61–1.24)
GFR, Estimated: >60 mL/min (ref >60)
Glucose, Bld: 112 mg/dL — ABNORMAL HIGH (ref 70–99)
Potassium: 3.7 mmol/L (ref 3.5–5.1)
Sodium: 140 mmol/L (ref 135–145)

## 2022-02-02 LAB — CBC
HCT: 35.4 % — ABNORMAL LOW (ref 39.0–52.0)
Hemoglobin: 11.9 g/dL — ABNORMAL LOW (ref 13.0–17.0)
MCH: 32.3 pg (ref 26.0–34.0)
MCHC: 33.6 g/dL (ref 30.0–36.0)
MCV: 96.2 fL (ref 80.0–100.0)
Platelets: 357 10*3/uL (ref 150–400)
RBC: 3.68 MIL/uL — ABNORMAL LOW (ref 4.22–5.81)
RDW: 12.2 % (ref 11.5–15.5)
WBC: 12 10*3/uL — ABNORMAL HIGH (ref 4.0–10.5)
nRBC: 0 % (ref 0.0–0.2)

## 2022-02-02 LAB — GLUCOSE, CAPILLARY
Glucose-Capillary: 100 mg/dL — ABNORMAL HIGH (ref 70–99)
Glucose-Capillary: 102 mg/dL — ABNORMAL HIGH (ref 70–99)
Glucose-Capillary: 104 mg/dL — ABNORMAL HIGH (ref 70–99)
Glucose-Capillary: 109 mg/dL — ABNORMAL HIGH (ref 70–99)
Glucose-Capillary: 98 mg/dL (ref 70–99)

## 2022-02-02 MED ORDER — QUETIAPINE FUMARATE 100 MG PO TABS
200.0000 mg | ORAL_TABLET | Freq: Two times a day (BID) | ORAL | Status: DC
Start: 2022-02-02 — End: 2022-02-04
  Administered 2022-02-02 – 2022-02-04 (×4): 200 mg via ORAL
  Filled 2022-02-02: qty 2
  Filled 2022-02-02: qty 1
  Filled 2022-02-02: qty 2
  Filled 2022-02-02: qty 1

## 2022-02-02 MED ORDER — CLONAZEPAM 0.25 MG PO TBDP
0.5000 mg | ORAL_TABLET | Freq: Two times a day (BID) | ORAL | Status: DC
Start: 2022-02-02 — End: 2022-02-04
  Administered 2022-02-02 – 2022-02-04 (×4): 0.5 mg via ORAL
  Filled 2022-02-02 (×4): qty 2

## 2022-02-02 MED ORDER — OXYCODONE HCL 5 MG/5ML PO SOLN: 10.0000 mg | ORAL | Status: DC | PRN

## 2022-02-02 MED ORDER — ACETAMINOPHEN 500 MG PO TABS
1000.0000 mg | ORAL_TABLET | Freq: Four times a day (QID) | ORAL | Status: DC
  Administered 2022-02-02 – 2022-02-04 (×7): 1000 mg via ORAL
  Filled 2022-02-02 (×8): qty 2

## 2022-02-02 MED ORDER — OXYCODONE HCL 5 MG/5ML PO SOLN: 5.0000 mg | ORAL | Status: DC | PRN

## 2022-02-02 MED ORDER — METHOCARBAMOL 500 MG PO TABS
1000.0000 mg | ORAL_TABLET | Freq: Three times a day (TID) | ORAL | Status: DC
Start: 2022-02-02 — End: 2022-02-04
  Administered 2022-02-02 – 2022-02-04 (×6): 1000 mg via ORAL
  Filled 2022-02-02 (×6): qty 2

## 2022-02-02 MED ORDER — POTASSIUM CHLORIDE CRYS ER 20 MEQ PO TBCR
40.0000 meq | EXTENDED_RELEASE_TABLET | ORAL | Status: AC
  Administered 2022-02-02 (×2): 40 meq via ORAL
  Filled 2022-02-02 (×2): qty 2

## 2022-02-02 NOTE — Progress Notes (Deleted)
OT Cancellation Note ? ?Patient Details ?Name: [facility] ?MRN: [id number] ?DOB: 1996/03/21 ? ? ?Cancelled Treatment:    Reason Eval/Treat Not Completed: Medical issues which prohibited therapy. Pt with recent re-intubation. OT will hold and continue to follow acutely ? ?James Howell ?02/02/2022, 9:11 AM ? ?Nyoka Cowden OTR/L ?Acute Rehabilitation Services ?Pager: [phone] ?Office: [phone] ? ?
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
## 2022-02-03 LAB — GLUCOSE, CAPILLARY: Glucose-Capillary: 94 mg/dL (ref 70–99)

## 2022-02-03 MED ORDER — ENSURE ENLIVE PO LIQD
237.0000 mL | Freq: Two times a day (BID) | ORAL | Status: DC
  Administered 2022-02-03 – 2022-02-04 (×2): 237 mL via ORAL
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
## 2022-02-03 NOTE — TOC Progression Note (Signed)
Transition of Care (TOC) - Progression Note  ? ? ?Patient Details  ?Name: [facility] ?MRN: [id number] ?Date of Birth: 1995-12-04 ? ?Transition of Care (TOC) CM/SW Contact  ?Glennon Mac, RN ?Phone Number: ?02/03/2022, 2:57 PM ? ?Clinical Narrative:    ?Plan admission to [facility] CIR on Saturday, May 6.     ? ? ?Expected Discharge Plan: IP Rehab Facility ?Barriers to Discharge: Continued Medical Work up ? ?Expected Discharge Plan and Services ?Expected Discharge Plan: IP Rehab Facility ?  ?Discharge Planning Services: CM Consult ?  ?Living arrangements for the past 2 months: Apartment ?                ?  ?  ?  ?  ?  ?  ?  ?  ?  ?  ? ? ?Social Determinants of Health (SDOH) Interventions ?  ? ?Readmission Risk Interventions ?   ? View : No data to display.  ?  ?  ?  ? ?Quintella Baton, RN, BSN  ?Trauma/Neuro ICU Case Manager ?[phone] ? ?
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
## 2022-02-03 NOTE — Progress Notes (Signed)
Inpatient Rehab Admissions Coordinator:  ? ?I met with patient and his spouse at bedside.  Video interpreter used.  Pt sleeping throughout interaction.  Explained recommendations for CIR and goals/expectations of CIR stay.  I reviewed 3 hrs/day of therapy (PT/OT/SLP), physician following daily, and average stay 2 weeks (but could be shorter depending on progress).  We reviewed that TBIs follow a fairly typical trajectory of recovery, with desire to go home being a common trait.  We reviewed current level of function, +2 assist with mobility and ADLs and limitations in cognition.  We reviewed goals for pt on rehab would be to achieve supervision level with mobility and ADLs to significant reduce burden of care. I also reviewed cost of care and what it looks like with screening for Medicaid.  Pt's spouse in agreement to pursue CIR admission and I have a bed available tomorrow.  Trauma service in agreement.  I will let pt/family and TOC team know.  ? ?Shann Medal, PT, DPT ?Admissions Coordinator ?[phone] ?02/03/22  ?1:43 PM ? ?
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
## 2022-02-03 NOTE — Progress Notes (Signed)
Transferred from ICU today. Patient is supposed to go to CIR tomorrow.  ?

## 2022-02-04 ENCOUNTER — Encounter (HOSPITAL_COMMUNITY): Payer: Self-pay | Admitting: Physical Medicine & Rehabilitation

## 2022-02-04 ENCOUNTER — Other Ambulatory Visit: Payer: Self-pay

## 2022-02-04 ENCOUNTER — Inpatient Hospital Stay (HOSPITAL_COMMUNITY)
Admission: RE | Admit: 2022-02-04 | Discharge: 2022-02-11 | DRG: 945 | Disposition: A | Discharge status: Home / Self Care | Payer: Self-pay | Source: Intra-hospital | Attending: Physical Medicine & Rehabilitation | Admitting: Physical Medicine & Rehabilitation

## 2022-02-04 DIAGNOSIS — F39 Unspecified mood [affective] disorder: Secondary | ICD-10-CM | POA: Diagnosis present

## 2022-02-04 DIAGNOSIS — R4189 Other symptoms and signs involving cognitive functions and awareness: Secondary | ICD-10-CM | POA: Diagnosis present

## 2022-02-04 DIAGNOSIS — D62 Acute posthemorrhagic anemia: Secondary | ICD-10-CM | POA: Diagnosis present

## 2022-02-04 DIAGNOSIS — J189 Pneumonia, unspecified organism: Secondary | ICD-10-CM | POA: Diagnosis present

## 2022-02-04 DIAGNOSIS — R4689 Other symptoms and signs involving appearance and behavior: Secondary | ICD-10-CM

## 2022-02-04 DIAGNOSIS — R4587 Impulsiveness: Secondary | ICD-10-CM | POA: Diagnosis present

## 2022-02-04 DIAGNOSIS — S066XAD Traumatic subarachnoid hemorrhage with loss of consciousness status unknown, subsequent encounter: Principal | ICD-10-CM

## 2022-02-04 DIAGNOSIS — S069XAA Unspecified intracranial injury with loss of consciousness status unknown, initial encounter: Secondary | ICD-10-CM | POA: Diagnosis present

## 2022-02-04 DIAGNOSIS — S069X0A Unspecified intracranial injury without loss of consciousness, initial encounter: Secondary | ICD-10-CM

## 2022-02-04 DIAGNOSIS — S069X0S Unspecified intracranial injury without loss of consciousness, sequela: Secondary | ICD-10-CM

## 2022-02-04 DIAGNOSIS — S069X2S Unspecified intracranial injury with loss of consciousness of 31 minutes to 59 minutes, sequela: Secondary | ICD-10-CM

## 2022-02-04 MED ORDER — OXYCODONE HCL 5 MG/5ML PO SOLN: 10.0000 mg | ORAL | Status: DC | PRN

## 2022-02-04 MED ORDER — ENOXAPARIN SODIUM 30 MG/0.3ML IJ SOSY
30.0000 mg | PREFILLED_SYRINGE | Freq: Two times a day (BID) | INTRAMUSCULAR | Status: DC
  Administered 2022-02-04 – 2022-02-08 (×9): 30 mg via SUBCUTANEOUS
  Filled 2022-02-04 (×10): qty 0.3

## 2022-02-04 MED ORDER — ENOXAPARIN SODIUM 30 MG/0.3ML IJ SOSY: 30.0000 mg | PREFILLED_SYRINGE | Freq: Two times a day (BID) | INTRAMUSCULAR | Status: DC

## 2022-02-04 MED ORDER — METHOCARBAMOL 500 MG PO TABS
1000.0000 mg | ORAL_TABLET | Freq: Three times a day (TID) | ORAL | Status: DC
  Administered 2022-02-04 – 2022-02-10 (×16): 1000 mg via ORAL
  Filled 2022-02-04 (×16): qty 2

## 2022-02-04 MED ORDER — CLONAZEPAM 0.25 MG PO TBDP
0.5000 mg | ORAL_TABLET | Freq: Two times a day (BID) | ORAL | Status: DC
  Administered 2022-02-04 – 2022-02-08 (×8): 0.5 mg via ORAL
  Filled 2022-02-04 (×8): qty 2

## 2022-02-04 MED ORDER — OXYCODONE HCL 5 MG/5ML PO SOLN: 5.0000 mg | ORAL | Status: DC | PRN

## 2022-02-04 MED ORDER — QUETIAPINE FUMARATE 200 MG PO TABS
200.0000 mg | ORAL_TABLET | Freq: Two times a day (BID) | ORAL | Status: DC
  Administered 2022-02-04 – 2022-02-08 (×8): 200 mg via ORAL
  Filled 2022-02-04 (×8): qty 1

## 2022-02-04 MED ORDER — ONDANSETRON 4 MG PO TBDP: 4.0000 mg | ORAL_TABLET | Freq: Four times a day (QID) | ORAL | Status: DC | PRN

## 2022-02-04 MED ORDER — ONDANSETRON HCL 4 MG/2ML IJ SOLN: 4.0000 mg | Freq: Four times a day (QID) | INTRAMUSCULAR | Status: DC | PRN
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
## 2022-02-04 NOTE — TOC Transition Note (Signed)
Transition of Care (TOC) - CM/SW Discharge Note ? ? ?Patient Details  ?Name: [facility] ?MRN: [id number] ?Date of Birth: Mar 13, 1996 ? ?Transition of Care (TOC) CM/SW Contact:  ?Bess Kinds, RN ?Phone Number: [phone] ?02/04/2022, 11:58 AM ? ? ?Clinical Narrative:    ? ?DC order for transition to CIR.  ? ?Final next level of care: IP Rehab Facility ?Barriers to Discharge: No Barriers Identified ? ? ?Patient Goals and CMS Choice ?  ?  ?  ? ?Discharge Placement ?  ?           ?  ?  ?  ?  ? ?Discharge Plan and Services ?  ?Discharge Planning Services: CM Consult ?           ?  ?  ?  ?  ?  ?  ?  ?  ?  ?  ? ?Social Determinants of Health (SDOH) Interventions ?  ? ? ?Readmission Risk Interventions ?   ? View : No data to display.  ?  ?  ?  ? ? ? ? ? ?

## 2022-02-04 NOTE — Progress Notes (Signed)
Inpatient Rehabilitation Admission Medication Review by a Pharmacist ? ?A complete drug regimen review was completed for this patient to identify any potential clinically significant medication issues. ? ?High Risk Drug Classes Is patient taking? Indication by Medication  ?Antipsychotic Yes Seroquel - agitation  ?Anticoagulant Yes Lovenox - VTE prophylaxis  ?Antibiotic No   ?Opioid Yes Oxycodone prn - pain  ?Antiplatelet No   ?Hypoglycemics/insulin No   ?Vasoactive Medication No   ?Chemotherapy No   ?Other Yes Methocarbamol - pain/ muscle spasms ?Clonazepam - mood  ? ? ? ?Type of Medication Issue Identified Description of Issue Recommendation(s)  ?Drug Interaction(s) (clinically significant) ?    ?Duplicate Therapy ?    ?Allergy ?    ?No Medication Administration End Date ?    ?Incorrect Dose ?    ?Additional Drug Therapy Needed ?    ?Significant med changes from prior encounter (inform family/care partners about these prior to discharge). Was not taking any medications prior to admission.   ?Other ?    ? ? ?Clinically significant medication issues were identified that warrant physician communication and completion of prescribed/recommended actions by midnight of the next day:  No ? ?Time spent performing this drug regimen review (minutes):  15 ? ?Dennie Fetters, RPh ?02/04/2022 5:09 PM ?
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
## 2022-02-05 DIAGNOSIS — Y95 Nosocomial condition: Secondary | ICD-10-CM

## 2022-02-05 DIAGNOSIS — J189 Pneumonia, unspecified organism: Secondary | ICD-10-CM

## 2022-02-05 DIAGNOSIS — S069X3S Unspecified intracranial injury with loss of consciousness of 1 hour to 5 hours 59 minutes, sequela: Secondary | ICD-10-CM
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
## 2022-02-05 NOTE — Progress Notes (Signed)
After continual reminders, family continues to transfer patient. Interpreter utilized to remind patient of safety precautions and calling for assistance with transfers. Bed alarm remains on and bed in lowest position. Chair alarm in place and call light in reach.  ?Mylo Red, LPN  ?
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
## 2022-02-05 NOTE — Discharge Instructions (Addendum)
Inpatient Rehab Discharge Instructions ? ?[facility] ?Discharge date and time: No discharge date for patient encounter.  ? ?Activities/Precautions/ Functional Status: ?Activity: As tolerated ?Diet: Regular ?Wound Care: Routine skin checks ?Functional status:  ?___ No restrictions     ___ Walk up steps independently ?___ 24/7 supervision/assistance   ___ Walk up steps with assistance ?___ Intermittent supervision/assistance  ___ Bathe/dress independently ?___ Walk with walker     _x__ Bathe/dress with assistance ?___ Walk Independently    ___ Shower independently ?___ Walk with assistance    ___ Shower with assistance ?___ No alcohol     ___ Return to work/school ________ ? ? ? ?COMMUNITY REFERRALS UPON DISCHARGE:   ? ?Outpatient: PT     OT    ST     ?            Agency:[facility]        Phone:[phone]  ?            Appointment Date/Time:*Please expect follow-up within 7-10 business days to schedule your appointment. If you have not received follow-up, be sure to contact the site directly. At time of your appointment, be sure to discuss financial assistance.* ? ? ? ?GENERAL COMMUNITY RESOURCES FOR PATIENT/FAMILY: ?You are scheduled for : ?Hospital Follow Up con Rema Fendt, NP ?mi?rcoles mayo 24, 2023 2:00 PM ?Please arrive 15 minutes prior to your appointment. ? ?Primary Care at [facility] ?[address] [employer], [address]  ?[place] [address] ?[phone] ? ? ? ? ? ? ? ?Special Instructions: ?No driving smoking alcohol or illicit drug use ? ? ?My questions have been answered and I understand these instructions. I will adhere to these goals and the provided educational materials after my discharge from the hospital. ? ?Patient/Caregiver Signature _______________________________ Date __________ ? ?Clinician Signature _______________________________________ Date __________ ? ?Please bring this form and your medication list with you to all your follow-up doctor's appointments.   ?

## 2022-02-05 NOTE — Plan of Care (Signed)
?  Problem: RH Problem Solving ?Goal: LTG Patient will demonstrate problem solving for (SLP) ?Description: LTG:  Patient will demonstrate problem solving for basic/complex daily situations with cues  (SLP) ?Flowsheets (Taken 02/05/2022 1627) ?LTG: Patient will demonstrate problem solving for (SLP): Basic daily situations ?LTG Patient will demonstrate problem solving for: Minimal Assistance - Patient > 75% ?  ?Problem: RH Memory ?Goal: LTG Patient will use memory compensatory aids to (SLP) ?Description: LTG:  Patient will use memory compensatory aids to recall biographical/new, daily complex information with cues (SLP) ?Flowsheets (Taken 02/05/2022 1627) ?LTG: Patient will use memory compensatory aids to (SLP): Minimal Assistance - Patient > 75% ?  ?Problem: RH Attention ?Goal: LTG Patient will demonstrate this level of attention during functional activites (SLP) ?Description: LTG:  Patient will will demonstrate this level of attention during functional activites (SLP) ?Flowsheets (Taken 02/05/2022 1627) ?Patient will demonstrate during cognitive/linguistic activities the attention type of: ? Sustained ? Selective ?Patient will demonstrate this level of attention during cognitive/linguistic activities in: Controlled ?LTG: Patient will demonstrate this level of attention during cognitive/linguistic activities with assistance of (SLP): Minimal Assistance - Patient > 75% ?  ?Problem: RH Awareness ?Goal: LTG: Patient will demonstrate awareness during functional activites type of (SLP) ?Description: LTG: Patient will demonstrate awareness during functional activites type of (SLP) ?Flowsheets (Taken 02/05/2022 1627) ?Patient will demonstrate during cognitive/linguistic activities awareness type of: Intellectual ?LTG: Patient will demonstrate awareness during cognitive/linguistic activities with assistance of (SLP): Supervision ?  ?

## 2022-02-05 NOTE — Progress Notes (Signed)
Pt became dizzy with PT session. Pt c/o dizziness while standing.  ?James Red, LPN  ? 69/45/03 1343  ?Vitals  ?Temp 98 ?F (36.7 ?C)  ?BP 108/78  ?MAP (mmHg) 86  ?BP Location Right Arm  ?BP Method Automatic  ?Patient Position (if appropriate) Sitting  ?Pulse Rate (!) 109  ?Resp 15  ?MEWS COLOR  ?MEWS Score Color Green  ?Oxygen Therapy  ?SpO2 97 %  ?O2 Device Room Air  ?MEWS Score  ?MEWS Temp 0  ?MEWS Systolic 0  ?MEWS Pulse 1  ?MEWS RR 0  ?MEWS LOC 0  ?MEWS Score 1  ? ? ?

## 2022-02-05 NOTE — Progress Notes (Addendum)
Ortho static vitals obtained, pt symptomatic..c/o dizziness. ?MD Riley Kill notified ?1419: no new orders at this time  ? ? 02/05/22 1400  ?Orthostatic Lying   ?BP- Lying 104/73  ?Pulse- Lying 84  ?Orthostatic Sitting  ?BP- Sitting 123/66  ?Pulse- Sitting 98  ?Orthostatic Standing at 0 minutes  ?BP- Standing at 0 minutes (!) 143/113  ?Orthostatic Standing at 3 minutes  ?BP- Standing at 3 minutes (!) 183/150  ?Pulse- Standing at 3 minutes 77  ? ? ?
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
## 2022-02-06 DIAGNOSIS — M79603 Pain in arm, unspecified: Secondary | ICD-10-CM

## 2022-02-06 DIAGNOSIS — S069X9D Unspecified intracranial injury with loss of consciousness of unspecified duration, subsequent encounter: Secondary | ICD-10-CM

## 2022-02-06 DIAGNOSIS — D62 Acute posthemorrhagic anemia: Secondary | ICD-10-CM

## 2022-02-06 LAB — COMPREHENSIVE METABOLIC PANEL
ALT: 76 U/L — ABNORMAL HIGH (ref 0–44)
AST: 25 U/L (ref 15–41)
Albumin: 3.2 g/dL — ABNORMAL LOW (ref 3.5–5.0)
Alkaline Phosphatase: 124 U/L (ref 38–126)
Anion gap: 9 (ref 5–15)
BUN: 22 mg/dL — ABNORMAL HIGH (ref 6–20)
CO2: 25 mmol/L (ref 22–32)
Calcium: 9.1 mg/dL (ref 8.9–10.3)
Chloride: 105 mmol/L (ref 98–111)
Creatinine, Ser: 1.06 mg/dL (ref 0.61–1.24)
GFR, Estimated: >60 mL/min (ref >60)
Glucose, Bld: 97 mg/dL (ref 70–99)
Potassium: 3.7 mmol/L (ref 3.5–5.1)
Sodium: 139 mmol/L (ref 135–145)
Total Bilirubin: 0.9 mg/dL (ref 0.3–1.2)
Total Protein: 7.8 g/dL (ref 6.5–8.1)

## 2022-02-06 LAB — CBC WITH DIFFERENTIAL/PLATELET
Abs Immature Granulocytes: 0.02 10*3/uL (ref 0.00–0.07)
Basophils Absolute: 0.1 10*3/uL (ref 0.0–0.1)
Basophils Relative: 1 %
Eosinophils Absolute: 0.1 10*3/uL (ref 0.0–0.5)
Eosinophils Relative: 3 %
HCT: 36.4 % — ABNORMAL LOW (ref 39.0–52.0)
Hemoglobin: 12.3 g/dL — ABNORMAL LOW (ref 13.0–17.0)
Immature Granulocytes: 0 %
Lymphocytes Relative: 34 %
Lymphs Abs: 1.7 10*3/uL (ref 0.7–4.0)
MCH: 31.9 pg (ref 26.0–34.0)
MCHC: 33.8 g/dL (ref 30.0–36.0)
MCV: 94.5 fL (ref 80.0–100.0)
Monocytes Absolute: 0.6 10*3/uL (ref 0.1–1.0)
Monocytes Relative: 11 %
Neutro Abs: 2.6 10*3/uL (ref 1.7–7.7)
Neutrophils Relative %: 51 %
Platelets: 467 10*3/uL — ABNORMAL HIGH (ref 150–400)
RBC: 3.85 MIL/uL — ABNORMAL LOW (ref 4.22–5.81)
RDW: 11.9 % (ref 11.5–15.5)
WBC: 5.1 10*3/uL (ref 4.0–10.5)
nRBC: 0 % (ref 0.0–0.2)
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
## 2022-02-06 NOTE — Care Management (Signed)
Inpatient Rehabilitation Center ?Individual Statement of Services ? ?Patient Name:  [facility]  ?Date:  02/06/2022 ? ?Welcome to the Inpatient Rehabilitation Center.  Our goal is to provide you with an individualized program based on your diagnosis and situation, designed to meet your specific needs.  With this comprehensive rehabilitation program, you will be expected to participate in at least 3 hours of rehabilitation therapies Monday-Friday, with modified therapy programming on the weekends. ? ?Your rehabilitation program will include the following services:  Physical Therapy (PT), Occupational Therapy (OT), Speech Therapy (ST), 24 hour per day rehabilitation nursing, Therapeutic Recreaction (TR), Psychology, Neuropsychology, Care Coordinator, Rehabilitation Medicine, Nutrition Services, Pharmacy Services, and Other ? ?Weekly team conferences will be held on Tuesdays to discuss your progress.  Your Inpatient Rehabilitation Care Coordinator will talk with you frequently to get your input and to update you on team discussions.  Team conferences with you and your family in attendance may also be held. ? ?Expected length of stay: 10-14 ?  ?Overall anticipated outcome: Supervision ? ?Depending on your progress and recovery, your program may change. Your Inpatient Rehabilitation Care Coordinator will coordinate services and will keep you informed of any changes. Your Inpatient Rehabilitation Care Coordinator's name and contact numbers are listed  below. ? ?The following services may also be recommended but are not provided by the Inpatient Rehabilitation Center:  ?Driving Evaluations ?Home Health Rehabiltiation Services ?Outpatient Rehabilitation Services ?Vocational Rehabilitation ?  ?Arrangements will be made to provide these services after discharge if needed.  Arrangements include referral to agencies that provide these services. ? ?Your insurance has been verified to be:  Uninsured ? ?Your primary doctor  is:  No PCP ? ?Pertinent information will be shared with your doctor and your insurance company. ? ?Inpatient Rehabilitation Care Coordinator:  Susie Cassette [phone] or (C) [phone] ? ?Information discussed with and copy given to patient by: Gretchen Short, 02/06/2022, 10:03 AM    ?
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
## 2022-02-06 NOTE — Progress Notes (Signed)
Inpatient Rehabilitation  Patient information reviewed and entered into eRehab system by Emberlynn Riggan M. Natlie Asfour, M.A., CCC/SLP, PPS Coordinator.  Information including medical coding, functional ability and quality indicators will be reviewed and updated through discharge.    

## 2022-02-06 NOTE — Progress Notes (Signed)
Occupational Therapy TBI Note ? ?Patient Details  ?Name: [facility] ?MRN: [id number] ?Date of Birth: [birth year] ? ?Today's Date: 02/06/2022 ?OT Individual Time: 1305-1400 ?OT Individual Time Calculation (min): 55 min  ? ? ?Short Term Goals: ?Week 1:  OT Short Term Goal 1 (Week 1): Pt will ambulate to bathroom and complete toilet transfer with supervision. ?OT Short Term Goal 2 (Week 1): Pt will complete UB/LB bathing at shower level with supervision. ?OT Short Term Goal 3 (Week 1): Pt will complete tub shower transfer with supervision with DME as needed. ?OT Short Term Goal 4 (Week 1): Pt will complete 3/3 toileting with supervision. ? ?Skilled Therapeutic Interventions/Progress Updates:  ?  Pt received sitting in the recliner with on c/o pain.Spanish interpretor present throughout session. Pt with flat affect and language barrier made it difficult to assess sequencing vs cultural preferences at times. For example, pt showered in his underwear despite OT cueing to remove- difficult to discern discomfort for privacy vs confusion. He completed mobility around room with CGA- supervision overall, with awareness to hold onto wall with isolated single leg stance. He bathed sit <> stand from the shower bench with (S) overall. He dressed with CGA overall in standing. He completed oral care in standing with (S). He completed 250 ft of functional mobilty to the therapy gym with CGA, ataxic gait with frequent balance perturbations but no overt LOB. He completed several memory and sequencing focused tasks on the BITS. He required cueing for memory strategies (verbal repetition), and was able to improve 3 item recall to 87% from 50%. He demonstrated fair carryover of self initiation of strategies. He required cueing for not using overt trial and error techniques when completing alphabet sequencing task- scoring only 37%. He returned to his room- 250 ft of functional mobility with min A d/t L bias. Pt was left supine,  looking for phone, passed off to SLP.   ? ? ?Therapy Documentation ?Precautions:  ?Precautions ?Precautions: Fall ?Restrictions ?Weight Bearing Restrictions: No ? ?  ?Agitated Behavior Scale: ?TBI ?Observation Details ?Observation Environment: [place] ?Start of observation period - Date: 02/06/22 ?Start of observation period - Time: 1300 ?End of observation period - Date: 02/06/22 ?End of observation period - Time: 1400 ?Agitated Behavior Scale (DO NOT LEAVE BLANKS) ?Short attention span, easy distractibility, inability to concentrate: Present to a moderate degree ?Impulsive, impatient, low tolerance for pain or frustration: Present to a slight degree ?Uncooperative, resistant to care, demanding: Absent ?Violent and/or threatening violence toward people or property: Absent ?Explosive and/or unpredictable anger: Absent ?Rocking, rubbing, moaning, or other self-stimulating behavior: Absent ?Pulling at tubes, restraints, etc.: Absent ?Wandering from treatment areas: Absent ?Restlessness, pacing, excessive movement: Absent ?Repetitive behaviors, motor, and/or verbal: Absent ?Rapid, loud, or excessive talking: Absent ?Sudden changes of mood: Absent ?Easily initiated or excessive crying and/or laughter: Absent ?Self-abusiveness, physical and/or verbal: Absent ?Agitated behavior scale total score: 17 ? ? ?Therapy/Group: Individual Therapy ? ?Curtis Sites ?02/06/2022, 3:03 PM ?

## 2022-02-06 NOTE — Progress Notes (Signed)
Physical Therapy Note ? ?Patient Details  ?Name: [facility] ?MRN: [id number] ?Date of Birth: Nov 07, 1995 ?Today's Date: 02/06/2022 ? ? ? ?Returned at 1415 and 1425 to attempt to make up missed time from this morning.  ?1415: Patient in bed with his wife at bedside. Patient on the phone and requesting PT return tomorrow. Unable to redirect patient and patient continued his phone call at this time  ?1425: Patient in bed alone in the room with his head under the covers. Patient with minimal arousal to verbal or tactile stimulation, but clearly stated "tomorrow, tomorrow." Will continue to attempt to make up missed time as able due to patient behaviors and arousal. Plan for behavior plan meeting with rehab team tomorrow to improve compliance with rules and therapy schedule.  ? ?Helayne Seminole PT, DPT ? ?02/06/2022, 4:20 PM  ?
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
## 2022-02-06 NOTE — Progress Notes (Signed)
SLP Cancellation Note ? ?Patient Details ?Name: [facility] ?MRN: [id number] ?DOB: [birth year] ? ? ?Cancelled treatment:      Patient missed 45 minutes of skilled SLP intervention due to fatigue. Patient was asleep during SLP's first attempt and was unable to be roused. SLP attempted again in the afternoon and patient declined due to fatigue. Will re-attempt as able. Continue with current plan of care.   ?                                              ?                                             ? ? ? ? ?[name] ?02/06/2022, 2:47 PM ?

## 2022-02-07 NOTE — Progress Notes (Signed)
Inpatient Rehabilitation Care Coordinator ?Assessment and Plan ?Patient Details  ?Name: [facility] ?MRN: [id number] ?Date of Birth: 1996-09-04 ? ?Today's Date: 02/07/2022 ? ?Hospital Problems: Principal Problem: ?  TBI (traumatic brain injury) (HCC) ? ?Past Medical History: History reviewed. No pertinent past medical history. ?Past Surgical History: History reviewed. No pertinent surgical history. ?Social History:  reports that he has never smoked. He has never used smokeless tobacco. He reports that he does not currently use alcohol. He reports that he does not use drugs. ? ?Family / Support Systems ?Marital Status: Single ?Patient Roles: Partner ?Spouse/Significant Other: Cheron Schaumann (partner) [phone] ?Children: 75 mos old dtr ?Other Supports: None reported ?Anticipated Caregiver: partner ?Ability/Limitations of Caregiver: His partner is not working at this time, and able to provide 24/7 care. Wife reports that his sister from [place] is coming tomorrow. ?Caregiver Availability: 24/7 ?Family Dynamics: Pt lives with his partner and their 40 mos old dtr ? ?Social History ?Preferred language: Spanish ?Religion:  ?Cultural Background: Pt is currenlty not working at [employer] time, and last worked 3 months ago. ?Education: some college ?Health Literacy - How often do you need to have someone help you when you read instructions, pamphlets, or other written material from your doctor or pharmacy?: Never ?Writes: Yes ?Employment Status: Unemployed ?Legal History/Current Legal Issues: Denies ?Guardian/Conservator: N.A  ? ?Abuse/Neglect ?Abuse/Neglect Assessment Can Be Completed: Unable to assess, patient is non-responsive or altered mental status ?Physical Abuse: Denies ?Verbal Abuse: Denies ?Sexual Abuse: Denies ?Exploitation of patient/patient's resources: Denies ?Self-Neglect: Denies ? ?Patient response to: ?Social Isolation - How often do you feel lonely or isolated from those around you?: Patient unable to  respond ? ?Emotional Status ?Pt's affect, behavior and adjustment status: Pt was sleeping at time of trying to complete assessment, and did not respond to his name being called. ?Recent Psychosocial Issues: Partner denies ?Psychiatric History: partner denies ?Substance Abuse History: Partner reports he smokes hooka sometimes. Denies etoh and rec drug use. ? ?Patient / Family Perceptions, Expectations & Goals ?Pt/Family understanding of illness & functional limitations: Pt partner has a general understanding of pt care needs ?Premorbid pt/family roles/activities: Independent ?Anticipated changes in roles/activities/participation: Assistance with ADLs/IADLs ?Pt/family expectations/goals: Partner would like him to work on his memory and hope his confusion improves. ? ?[facility] ?Community Agencies: None ?Premorbid Home Care/DME Agencies: None ?Transportation available at discharge: TBD ?Is the patient able to respond to transportation needs?: Yes ?In the past 12 months, has lack of transportation kept you from medical appointments or from getting medications?: No ?In the past 12 months, has lack of transportation kept you from meetings, work, or from getting things needed for daily living?: No ?Resource referrals recommended: Neuropsychology ? ?Discharge Planning ?Living Arrangements: Spouse/significant other, Children ?Support Systems: Spouse/significant other, Other relatives ?Type of Residence: Private residence ?Insurance Resources: Self-pay ?Financial Resources: Family Support ?Financial Screen Referred: Yes ?Living Expenses: Lives with family ?Money Management: Patient, Spouse ?Does the patient have any problems obtaining your medications?: No ?Home Management: Both manage home care needs ?Patient/Family Preliminary Plans: TBD ?Care Coordinator Barriers to Discharge: Decreased caregiver support, Lack of/limited family support, Insurance for SNF coverage, Other (comments) ?Care Coordinator Barriers to  Discharge Comments: Uninsured. ?Care Coordinator Anticipated Follow Up Needs: HH/OP ?Expected length of stay: 10-14 days ? ?Clinical Impression ?SW made several attempts to meet with pt but pt would not wake up. ? ?SW used [employer] LK#[id number] to complete assessment with his partner [name]. She reports he is not a [place] Citizen. He is  not a veteran. No formal HCPOA, however, reports his father Ilija Maxim Sr [phone]) who lives in [place] can make decision. SW explained role, and d/c process. Aware SW will follow-up tomorrow after team conference.  ? ?Gretchen Short ?02/07/2022, 9:49 AM ? ?  ?

## 2022-02-07 NOTE — Progress Notes (Signed)
Occupational Therapy TBI Note ? ?Patient Details  ?Name: James Howell ?MRN: [id number] ?Date of Birth: [birth year] ? ?Today's Date: 02/07/2022 ?OT Individual Time: 3559-7416 ?OT Individual Time Calculation (min): 70 min  ? ? ?Short Term Goals: ?Week 1:  OT Short Term Goal 1 (Week 1): Pt will ambulate to bathroom and complete toilet transfer with supervision. ?OT Short Term Goal 2 (Week 1): Pt will complete UB/LB bathing at shower level with supervision. ?OT Short Term Goal 3 (Week 1): Pt will complete tub shower transfer with supervision with DME as needed. ?OT Short Term Goal 4 (Week 1): Pt will complete 3/3 toileting with supervision. ? ?Skilled Therapeutic Interventions/Progress Updates:  ?  Pt received supine with no c/o pain, agreeable to OT session. Wife present. Interpretor Jorene Guest present. Provided edu on conference today, OT POC, and. Pt completed 200 ft of functional mobility with CGA overall, no LOB. He required min cueing for pacing. He completed several dynamic standing balance activities with focus on awareness of balance perturbations and truncal sway L. Extensive time during session spent providing feedback/education on deficits, especially balance and fall risk. He required CGA-min A when isolating single leg stance. He completed 2x5 blocked practice floor transfers, supine > stand with focus on trunk stability during transitional movements and awareness of balance perturbations. CGA required. Worked on ankle righting reactions with dynamic posterior lean onto a stable surface and then bringing trunk forward back off. He completed dynamic forward stepping in/out of a narrow BOS with increased pace to challenge- CGA-min A required with several slight LOB with L step forward. He then completed 200 ft of functional mobility to the therapy gym. Pt was strapped into the litegait harness and he completed 200 ft forward but had poor sequencing to not sit into harness and maintain upright posture. He  became frustrated by OT cueing but was able to self regulate. He returned to his room and his wife was present. His wife was checked off for bathroom transfers and she demonstrated safe handling techniques. Pt was left supine with all needs met, bed alarm set.  ? ? ?Therapy Documentation ?Precautions:  ?Precautions ?Precautions: Fall ?Restrictions ?Weight Bearing Restrictions: No ?Agitated Behavior Scale: ?TBI ?Observation Details ?Observation Environment: [place] ?Start of observation period - Date: 02/07/22 ?Start of observation period - Time: 0915 ?End of observation period - Date: 02/07/22 ?End of observation period - Time: 1030 ?Agitated Behavior Scale (DO NOT LEAVE BLANKS) ?Short attention span, easy distractibility, inability to concentrate: Present to a slight degree ?Impulsive, impatient, low tolerance for pain or frustration: Present to a slight degree ?Uncooperative, resistant to care, demanding: Absent ?Violent and/or threatening violence toward people or property: Absent ?Explosive and/or unpredictable anger: Absent ?Rocking, rubbing, moaning, or other self-stimulating behavior: Absent ?Pulling at tubes, restraints, etc.: Absent ?Wandering from treatment areas: Absent ?Restlessness, pacing, excessive movement: Absent ?Repetitive behaviors, motor, and/or verbal: Absent ?Rapid, loud, or excessive talking: Absent ?Sudden changes of mood: Absent ?Easily initiated or excessive crying and/or laughter: Absent ?Self-abusiveness, physical and/or verbal: Absent ?Agitated behavior scale total score: 16 ? ? ? ?Therapy/Group: Individual Therapy ? ?Curtis Sites ?02/07/2022, 10:07 AM ?

## 2022-02-07 NOTE — Progress Notes (Addendum)
Physical Therapy TBI Note  Patient Details  Name: James Howell MRN: [id number] Date of Birth: [birth year]  Today's Date: 02/07/2022 PT Individual Time: 1130-1200 and 1415-1515 PT Individual Time Calculation (min): 30 min and 60 min   Short Term Goals: Week 1:  PT Short Term Goal 1 (Week 1): Patient will perform formal balance test. PT Short Term Goal 2 (Week 1): Patient will perform basic transfers with supervision consistently. PT Short Term Goal 3 (Week 1): Patient will ambulate >200 feet without dizziness with CGA.  Skilled Therapeutic Interventions/Progress Updates:     Session 1: Patient sitting EOB handed off by SLP, eating a few bites of lunch upon PT arrival. Patient alert and agreeable to PT session. Patient denied pain during session.  Patient performed in room mobility to retrieve tennis shoes from his suitcase with CGA-supervision without and AD. Able to recall location of shoes independently. Donned shoes safely in sitting independently. Patient performed sit to stand transfers with supervision throughout session, did not require cues for impulsivity today.   Patient ambulated to the Day Room with close supervision with intermittent CGA for steadying support with L veering x2 with cues for directions.   Patient performed dynamic standing balance and progressed to dynamic gait and standing balance while playing basket ball x10 min before patient self-selected to perform a different task. Performed mobility with CGA-close supervision, intermittent min A with lateral LOB x3. Patient demonstrated quick stepping, quick turn to stops, bending down with safety awareness to hold on to an object for stability.   Patient performed sit to stands x1 min while competing with PT for increased speed and motivation with task. Patient kept track of his number of stands, confirmed by interpreter.   Patient ambulated back to his room, as above, able to path find back to his room without  cues.   Patient in recliner facing the window per pt request with Telesitter in the room at end of session with breaks locked, chair alarm set, and all needs within reach.   Session 2: Patient in bed upon PT arrival. Patient alert and agreeable to PT session. Patient denied pain during session.  In-person interpreter present throughout session.   Patient asked to wait to have PT tomorrow, educated on importance of participation in therapy for recovery, patient with poor frustration tolerance, stood up and walked out the door with PT providing supervision for safety due to impulsivity. Discussed behavior with patient, patient reports he just wants to sleep to make the days go faster, so he can go home. Educated him on demonstrating improved performance and participation in therapies may lead to an earlier d/c date. Educated patient on his improved balance and mobility since evaluation on Sunday. Patient agreeable to re-do balance assessments performed on eval to assess progress and current fall risk level.  Therapeutic Activity: Bed Mobility: Patient performed supine to/from sit independently in a flat bed without use of bed rails. Transfers: Patient performed sit to/from stand with supervision for safety due to decreased balance strategies throughout session.   Gait Training:  Patient ambulated >100 feet x3 without an AD with close supervision, continues to veer from straight path L>R and bump into wall or door frames on the L x2. Ambulated with variable foot placement, narrow BOS with scissoring gait with veering. Provided verbal cues for visual scanning L>R and self correction of veering to reduce assist with gait.  Neuromuscular Re-ed: Berg Balance Test Sit to Stand: Able to stand without using hands and stabilize  independently Standing Unsupported: Able to stand safely 2 minutes Sitting with Back Unsupported but Feet Supported on Floor or Stool: Able to sit safely and securely 2  minutes Stand to Sit: Sits safely with minimal use of hands Transfers: Able to transfer safely, minor use of hands Standing Unsupported with Eyes Closed: Able to stand 10 seconds safely Standing Ubsupported with Feet Together: Needs help to attain position but able to stand for 30 seconds with feet together From Standing, Reach Forward with Outstretched Arm: Can reach confidently >25 cm (10") From Standing Position, Pick up Object from Floor: Able to pick up shoe safely and easily From Standing Position, Turn to Look Behind Over each Shoulder: Looks behind from both sides and weight shifts well Turn 360 Degrees: Able to turn 360 degrees safely in 4 seconds or less Standing Unsupported, Alternately Place Feet on Step/Stool: Able to stand independently and safely and complete 8 steps in 20 seconds Standing Unsupported, One Foot in Front: Able to plae foot ahead of the other independently and hold 30 seconds Standing on One Leg: Able to lift leg independently and hold equal to or more than 3 seconds Total Score: 50/56 (improved from 41/56 on 5/8) Patient demonstrated increased fall risk noted by score of 50/56 on the Berg Balance Scale.  <45/56 = fall risk, <42/56 = predictive of recurrent falls, <40/56 = 100% fall risk  >41 = independent, 21-40 = assistive device, 0-20 = wheelchair level  MDC 6.9 (4 pts 45-56, 5 pts 35-44, 7 pts 25-34) (ANPTA Core Set of Outcome Measures for Adults with Neurologic Conditions, 2018)  Functional Gait  Assessment Gait Level Surface: Walks 20 ft in less than 5.5 sec, no assistive devices, good speed, no evidence for imbalance, normal gait pattern, deviates no more than 6 in outside of the 12 in walkway width. Change in Gait Speed: Makes only minor adjustments to walking speed, or accomplishes a change in speed with significant gait deviations, deviates 10-15 in outside the 12 in walkway width, or changes speed but loses balance but is able to recover and continue  walking. Gait with Horizontal Head Turns: Performs head turns smoothly with slight change in gait velocity (eg, minor disruption to smooth gait path), deviates 6-10 in outside 12 in walkway width, or uses an assistive device. Gait with Vertical Head Turns: Performs task with slight change in gait velocity (eg, minor disruption to smooth gait path), deviates 6 - 10 in outside 12 in walkway width or uses assistive device Gait and Pivot Turn: Pivot turns safely in greater than 3 sec and stops with no loss of balance, or pivot turns safely within 3 sec and stops with mild imbalance, requires small steps to catch balance. Step Over Obstacle: Is able to step over 2 stacked shoe boxes taped together (9 in total height) without changing gait speed. No evidence of imbalance. Gait with Narrow Base of Support: Ambulates less than 4 steps heel to toe or cannot perform without assistance. Gait with Eyes Closed: Walks 20 ft, uses assistive device, slower speed, mild gait deviations, deviates 6-10 in outside 12 in walkway width. Ambulates 20 ft in less than 9 sec but greater than 7 sec. Ambulating Backwards: Walks 20 ft, uses assistive device, slower speed, mild gait deviations, deviates 6-10 in outside 12 in walkway width. Steps: Alternating feet, must use rail. Total Score: 19/30 (improved from 0/30 on 5/7) Patient demonstrates increased fall risk as noted by score of 19/30 on  Functional Gait Assessment.  (<19=increased fall  risk with dynamic gait)  Reviewed results, interpretation, and progress of assessments performed. Patient pleased with his improvement. Asked for further assessment of his balance tomorrow. Reports he is eager to go home.   Patient reported fatigue and requested to return to the room, asked to perform all other offered interventions tomorrow. Patient agreeable to review the [facility] TBI handbook and handouts related to TBI if provided. Provided patient with these materials in Spanish for  patient and his family to review. Educated patient on key points to review in the handbook and on each of the handouts provided. Patient appreciative.   Patient missed 15 min of skilled PT due to fatigue, RN made aware. Will attempt to make-up missed time as able.    Patient in bed at end of session with breaks locked, bed alarm set, and all needs within reach.   Therapy Documentation Precautions:  Precautions Precautions: Fall Restrictions Weight Bearing Restrictions: No General: PT Amount of Missed Time (min): 15 Minutes PT Missed Treatment Reason: Patient fatigue Agitated Behavior Scale: TBI Observation Details Observation Environment: [facility] Start of observation period - Date: 02/07/22 Start of observation period - Time: 1130 End of observation period - Date: 02/07/22 End of observation period - Time: 1200 Agitated Behavior Scale (DO NOT LEAVE BLANKS) Short attention span, easy distractibility, inability to concentrate: Present to a slight degree Impulsive, impatient, low tolerance for pain or frustration: Present to a slight degree Uncooperative, resistant to care, demanding: Absent Violent and/or threatening violence toward people or property: Absent Explosive and/or unpredictable anger: Absent Rocking, rubbing, moaning, or other self-stimulating behavior: Absent Pulling at tubes, restraints, etc.: Absent Wandering from treatment areas: Absent Restlessness, pacing, excessive movement: Absent Repetitive behaviors, motor, and/or verbal: Absent Rapid, loud, or excessive talking: Absent Sudden changes of mood: Absent Easily initiated or excessive crying and/or laughter: Absent Self-abusiveness, physical and/or verbal: Absent Agitated behavior scale total score: 16 Agitated Behavior Scale: TBI Observation Details Observation Environment: [facility] Start of observation period - Date: 02/07/22 Start of observation period - Time: 1415 End of observation period - Date: 02/07/22 End  of observation period - Time: 1515 Agitated Behavior Scale (DO NOT LEAVE BLANKS) Short attention span, easy distractibility, inability to concentrate: Present to a slight degree Impulsive, impatient, low tolerance for pain or frustration: Present to a moderate degree Uncooperative, resistant to care, demanding: Present to a slight degree Violent and/or threatening violence toward people or property: Absent Explosive and/or unpredictable anger: Absent Rocking, rubbing, moaning, or other self-stimulating behavior: Absent Pulling at tubes, restraints, etc.: Absent Wandering from treatment areas: Absent Restlessness, pacing, excessive movement: Absent Repetitive behaviors, motor, and/or verbal: Absent Rapid, loud, or excessive talking: Absent Sudden changes of mood: Absent Easily initiated or excessive crying and/or laughter: Absent Self-abusiveness, physical and/or verbal: Absent Agitated behavior scale total score: 18    Therapy/Group: Individual Therapy  Murray Guzzetta L Yaakov Saindon PT, DPT  02/07/2022, 3:19 PM

## 2022-02-07 NOTE — Plan of Care (Signed)
Behavioral Plan  ? ?[place] Level: VI-VII ? ?Behavior to decrease/ eliminate:  ?-participation in therapy ?-interrupting therapy with phone calls ?-impulsivity ? ? ?Changes to environment:  ?-Lights on, blinds open during the day; off and closed at night ?-TV off by 8pm ? ? ?Interventions: ?-work toward family being cleared for transfers in the room ?-leave phone in the room during therapies ?-patient's wife cleared for transfers in the room ? ?Recommendations for interactions with patient: ?-encouraged sitting OOB ?-use interpreter with all interactions, pt does not speak english ? ?Attendees:  ?Blanch Media, OT, Serina Cowper, PT, Feliberto Gottron, SLP, Kennyth Arnold, RN ?

## 2022-02-07 NOTE — Progress Notes (Signed)
Patient ID: James Howell, male   DOB: Dec 27, 1995, 26 y.o.   MRN: [id number] ? ?SW met with pt, pt brother, and his friend using [employer] Spanish interpreter [place] [phone] to provide updates from team conference, and d/c date 5/14. SW shared that SW will follow-up with [name].  ? ?SW spoke with pt partner [name] using [place] [phone] to provide updates from team conference. SW discussed family education. Scheduled for Friday 1pm-3:30pm with partner and sister. SW answered questions related to outpatient therapies and cost associated due to being uninsured. SW shared will review final d/c plan on Friday.  ? ?Loralee Pacas, MSW, LCSWA ?Office: [phone] ?Cell: [phone] ?Fax: [phone]  ?

## 2022-02-07 NOTE — Progress Notes (Signed)
Speech Language Pathology TBI Note ? ?Patient Details  ?Name: [facility] ?MRN: [id number] ?Date of Birth: March 26, 1996 ? ?Today's Date: 02/07/2022 ?SLP Individual Time: 1100-1130 ?SLP Individual Time Calculation (min): 30 min ? ?Short Term Goals: ?Week 1: SLP Short Term Goal 1 (Week 1): Pt will use compensatory aids for recall of important information with minA verbal cues. ?SLP Short Term Goal 2 (Week 1): Pt will sustain attention to therapeutic task >10 minutes with minA verbal cues. ?SLP Short Term Goal 3 (Week 1): Pt will identify at least 2 changes s/p CVA and how they impact safety with modA verbal cues. ?SLP Short Term Goal 4 (Week 1): Pt will solve mildly complex problems with minA verbal cues. ? ?Skilled Therapeutic Interventions: Skilled treatment session focused on cognitive goals. Upon arrival, patient was awake in bed and eating a meal that was brought to him from home. Patient appropriately stopped eating and was agreeable to treatment session. SLP facilitated session by providing extra time and supervision level verbal cues to self-monitor and correct errors while counting money. However, patient completed basic money management tasks with Mod I for problem solving. Patient handed off to PT. Continue with current plan of care.  ?   ? ?Pain ?No/Denies Pain  ? ?Agitated Behavior Scale: ?TBI ?Observation Details ?Observation Environment: [facility] ?Start of observation period - Date: 02/07/22 ?Start of observation period - Time: 1100 ?End of observation period - Date: 02/07/22 ?End of observation period - Time: 1130 ?Agitated Behavior Scale (DO NOT LEAVE BLANKS) ?Short attention span, easy distractibility, inability to concentrate: Present to a slight degree ?Impulsive, impatient, low tolerance for pain or frustration: Present to a slight degree ?Uncooperative, resistant to care, demanding: Absent ?Violent and/or threatening violence toward people or property: Absent ?Explosive and/or unpredictable anger:  Absent ?Rocking, rubbing, moaning, or other self-stimulating behavior: Absent ?Pulling at tubes, restraints, etc.: Absent ?Wandering from treatment areas: Absent ?Restlessness, pacing, excessive movement: Absent ?Repetitive behaviors, motor, and/or verbal: Absent ?Rapid, loud, or excessive talking: Absent ?Sudden changes of mood: Absent ?Easily initiated or excessive crying and/or laughter: Absent ?Self-abusiveness, physical and/or verbal: Absent ?Agitated behavior scale total score: 16 ? ?Therapy/Group: Individual Therapy ? ?Noora Locascio ?02/07/2022, 12:22 PM ?
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
## 2022-02-07 NOTE — Patient Care Conference (Signed)
Inpatient RehabilitationTeam Conference and Plan of Care Update ?Date: 02/07/2022   Time: 10:07 AM  ? ? ?Patient Name: [facility]      ?Medical Record Number: [id number]  ?Date of Birth: 04-Mar-1996 ?Sex: Male         ?Room/Bed: [id number] ?Payor Info: Payor: MED PAY / Plan: MED PAY ASSURANCE / Product Type: *No Product type* /   ? ?Admit Date/Time:  02/04/2022  2:30 PM ? ?Primary Diagnosis:  TBI (traumatic brain injury) ([place]) ? ?Hospital Problems: Principal Problem: ?  TBI (traumatic brain injury) ([place]) ? ? ? ?Expected Discharge Date: Expected Discharge Date: 02/12/22 ? ?Team Members Present: ?Physician leading conference: Dr. Alger Simons ?Social Worker Present: Loralee Pacas, LCSWA ?Nurse Present: Dorthula Nettles, RN ?PT Present: Apolinar Junes, PT ?OT Present: Mariane Masters, OT ?SLP Present: Weston Anna, SLP ?PPS Coordinator present : Gunnar Fusi, SLP ? ?   Current Status/Progress Goal Weekly Team Focus  ?Bowel/Bladder ? ? cont of b/b LBM 5/8  remain cont.  assess q shift andprn   ?Swallow/Nutrition/ Hydration ? ?           ?ADL's ? ? CGA overall, decreased safety awareness, L lean/sway when ambulating. RLAS VI  Supervision overall  dynamic balance, ADL retraining, strengthening, cognitive remediation   ?Mobility ? ? Mod I bed mobility, supervision transfers, CGA-min A gait no AD, Berg 41/56 and 6MWT 1080 ft on 5/8, orthostatic in static standing, improves with mobility  Supervision overall  Activity tolerance, functional mobility, gait and stair training, balance, safety awareness, awareness of deficits, dual task training, community integration   ?Communication ? ?           ?Safety/Cognition/ Behavioral Observations ? Rancho Level VI: Mod A  Supervision-Min A  functional problem solving, recall of daily information, selective attention   ?Pain ? ? no complaints of pain  remain pain free  asess q shift and prn   ?Skin ? ? scabs, otherwise intact  no new breakdown  assess q shift and  prn   ? ? ?Discharge Planning:  ?Uninsured. Pt will have 24/7 care from his partner who lives in the home and cares for their 57mos old dtr.   ?Team Discussion: ?Doesn't speak [place]. Impulsive. Rancho VI-VII. Continent B/B, receives scheduled Robaxin, reports no pain. Girlfriend to provide 24/7 care. ? ?Patient on target to meet rehab goals: ?yes, supervision goals. Currently CGA overall. Left lean and sway. Tolerance improving. Balance deficits, CGA to min assist. Likes to rest. Mod assist with cognition and problem solving. ? ?*See Care Plan and progress notes for long and short-term goals.  ? ?Revisions to Treatment Plan:  ?None at this time. ?  ?Teaching Needs: ?Family education, medication management, safety awareness, transfer/gait training, etc. ?  ?Current Barriers to Discharge: ?Decreased caregiver support, Home enviroment access/layout, Lack of/limited family support, and uninsured. ? ?Possible Resolutions to Barriers: ?Family education ?Follow up therapy ?  ? ? Medical Summary ?Current Status: tbi rlas vi/vii. bp a little labile but he's acclimating with therapy.  impulsive ? Barriers to Discharge: Behavior;Medical stability ?  ?Possible Resolutions to [employer]: optimize sleep, safety plan, pt/family ed ? ? ?Continued Need for Acute Rehabilitation Level of Care: The patient requires daily medical management by a physician with specialized training in physical medicine and rehabilitation for the following reasons: ?Direction of a multidisciplinary physical rehabilitation program to maximize functional independence : Yes ?Medical management of patient stability for increased activity during participation in an intensive rehabilitation regime.: Yes ?Analysis  of laboratory values and/or radiology reports with any subsequent need for medication adjustment and/or medical intervention. : Yes ? ? ?I attest that I was present, lead the team conference, and concur with the assessment and plan of the  team. ? ? ?Dorthula Nettles G ?02/07/2022, 1:31 PM  ? ? ? ? ? ? ?
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
## 2022-02-08 MED ORDER — QUETIAPINE FUMARATE 50 MG PO TABS
150.0000 mg | ORAL_TABLET | Freq: Two times a day (BID) | ORAL | Status: DC
  Administered 2022-02-08 – 2022-02-09 (×2): 150 mg via ORAL
  Filled 2022-02-08 (×2): qty 3

## 2022-02-08 MED ORDER — CLONAZEPAM 0.25 MG PO TBDP
0.2500 mg | ORAL_TABLET | Freq: Two times a day (BID) | ORAL | Status: DC
  Administered 2022-02-08 – 2022-02-09 (×2): 0.25 mg via ORAL
  Filled 2022-02-08 (×2): qty 1

## 2022-02-08 NOTE — Progress Notes (Signed)
Patient ID: James Howell, male   DOB: May 09, 1996, 26 y.o.   MRN: [id number] ? ?Pt set up for [facility] Medication assistance program.  ? ?F/U with financial counseling to check status of financial screen and waiting on updates. ?*Financial Counselor-Saprese Jones completed screen with pt s/o yesterday. Will get incompetency letter for physician to sign. ? ?Pt has hospital follow-up appt at [facility] on 5/24 at 2pm with Ricky Stabs, NP. ? ?Outpatient PT/OT/SLP referral faxed to [facility] (p:[phone]/f:[phone]). ? ?Cecile Sheerer, MSW, LCSWA ?Office: [phone] ?Cell: [phone] ?Fax: [phone]  ?

## 2022-02-08 NOTE — Progress Notes (Signed)
Speech Language Pathology TBI Note ? ?Patient Details  ?Name: [facility] ?MRN: [id number] ?Date of Birth: [birth year] ? ?Today's Date: 02/08/2022 ?SLP Individual Time: 1497-0263 ?SLP Individual Time Calculation (min): 55 min ? ?Short Term Goals: ?Week 1: SLP Short Term Goal 1 (Week 1): Pt will use compensatory aids for recall of important information with minA verbal cues. ?SLP Short Term Goal 2 (Week 1): Pt will sustain attention to therapeutic task >10 minutes with minA verbal cues. ?SLP Short Term Goal 3 (Week 1): Pt will identify at least 2 changes s/p CVA and how they impact safety with modA verbal cues. ?SLP Short Term Goal 4 (Week 1): Pt will solve mildly complex problems with minA verbal cues. ? ?Skilled Therapeutic Interventions: Skilled treatment session focused on cognitive goals. Upon arrival, patient was awake in the recliner and agreeable to treatment session. SLP facilitated session by administering the Cognistat. Patient scored WFL on all subtests with the exceptions of mild impairments in attention and visual construction and Mod impairments in working memory. Throughout the assessment, patient required encouragement for attention and full participation. Patient transferred back to bed at end of session and left with alarm on and all needs within reach. Continue with current plan of care.  ?   ? ?Pain ?No/Denies Pain  ? ?Agitated Behavior Scale: ?TBI ?Observation Details ?Observation Environment: CIR ?Start of observation period - Date: 02/08/22 ?Start of observation period - Time: 0920 ?End of observation period - Date: 02/08/22 ?End of observation period - Time: 1015 ?Agitated Behavior Scale (DO NOT LEAVE BLANKS) ?Short attention span, easy distractibility, inability to concentrate: Present to a slight degree ?Impulsive, impatient, low tolerance for pain or frustration: Present to a slight degree ?Uncooperative, resistant to care, demanding: Present to a slight degree ?Violent and/or  threatening violence toward people or property: Absent ?Explosive and/or unpredictable anger: Absent ?Rocking, rubbing, moaning, or other self-stimulating behavior: Absent ?Pulling at tubes, restraints, etc.: Absent ?Wandering from treatment areas: Absent ?Restlessness, pacing, excessive movement: Absent ?Repetitive behaviors, motor, and/or verbal: Absent ?Rapid, loud, or excessive talking: Absent ?Sudden changes of mood: Absent ?Easily initiated or excessive crying and/or laughter: Absent ?Self-abusiveness, physical and/or verbal: Absent ?Agitated behavior scale total score: 17 ? ?Therapy/Group: Individual Therapy ? ?Mical Brun ?02/08/2022, 12:44 PM ?
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
## 2022-02-08 NOTE — Progress Notes (Signed)
Occupational Therapy TBI Note ? ?Patient Details  ?Name: [name] ?MRN: [id number] ?Date of Birth: 12/22/1995 ? ?Today's Date: 02/08/2022 ?OT Individual Time: 1100-1200 ?OT Individual Time Calculation (min): 60 min  ? ? ?Short Term Goals: ?Week 1:  OT Short Term Goal 1 (Week 1): Pt will ambulate to bathroom and complete toilet transfer with supervision. ?OT Short Term Goal 2 (Week 1): Pt will complete UB/LB bathing at shower level with supervision. ?OT Short Term Goal 3 (Week 1): Pt will complete tub shower transfer with supervision with DME as needed. ?OT Short Term Goal 4 (Week 1): Pt will complete 3/3 toileting with supervision. ? ?Skilled Therapeutic Interventions/Progress Updates:  ?   ?Pt received in bed with sister present having not seen pt in six years. Aroused pt and pt quickly embraced sister for a long moment. Pt agreeable to OT and encouraged to participate by sister.  ? ?Therapeutic activity ?Focus of session on dynamic balance and functional mobility with purposeful perturbations to improve righting reactions and reactive stepping.  ?- obstacle course with bilateral and unilateral kettlebell carries onto and over steps + compliant surface  ?- ball toss with forwards +backwards walking; next round  +naming animals with 3 errors repeating but does not stop talking ?- forward, backward and lateral walking with OT holding green theraband around waist for resistance min LOB with lateral & backwards walking ?-resumed ball toss with green theraband around waist with intentional use of band to pull pt off balance to force reactive stepping while tossing/catching ball.  ?- alternating attention activity between graded peg board and pipe tree. Pt able to build pipe tree from memory of image with no errors but with 6 errors on prg board using picture as reference.   ? ?Pt left at end of session in bed with exit alarm on, call light in reach and all needs met ? ? ?Therapy Documentation ?Precautions:   ? ?  ?Agitated Behavior Scale: ?TBI ?Observation Details ?Observation Environment: CIR ?Start of observation period - Date: 02/08/22 ?Start of observation period - Time: 1100 ?End of observation period - Date: 02/08/22 ?End of observation period - Time: 1200 ?Agitated Behavior Scale (DO NOT LEAVE BLANKS) ?Short attention span, easy distractibility, inability to concentrate: Present to a slight degree ?Impulsive, impatient, low tolerance for pain or frustration: Present to a slight degree ?Uncooperative, resistant to care, demanding: Present to a slight degree ?Violent and/or threatening violence toward people or property: Absent ?Explosive and/or unpredictable anger: Absent ?Rocking, rubbing, moaning, or other self-stimulating behavior: Absent ?Pulling at tubes, restraints, etc.: Absent ?Wandering from treatment areas: Absent ?Restlessness, pacing, excessive movement: Absent ?Repetitive behaviors, motor, and/or verbal: Absent ?Rapid, loud, or excessive talking: Absent ?Sudden changes of mood: Absent ?Easily initiated or excessive crying and/or laughter: Absent ?Self-abusiveness, physical and/or verbal: Absent ?Agitated behavior scale total score: 17 ? ? ?Therapy/Group: Individual Therapy ? ?[name] ?02/08/2022, 12:29 PM ?

## 2022-02-08 NOTE — Progress Notes (Addendum)
Physical Therapy TBI Note ? ?Patient Details  ?Name: [facility] ?MRN: [id number] ?Date of Birth: August 14, 1996 ? ?Today's Date: 02/08/2022 ?PT Individual Time: [id number] ?PT Individual Time Calculation (min): 70 min  ? ?Short Term Goals: ?Week 1:  PT Short Term Goal 1 (Week 1): Patient will perform formal balance test. ?PT Short Term Goal 2 (Week 1): Patient will perform basic transfers with supervision consistently. ?PT Short Term Goal 3 (Week 1): Patient will ambulate >200 feet without dizziness with CGA. ? ?Skilled Therapeutic Interventions/Progress Updates:  ?   ?Patient in bed in the room with is sister present upon PT arrival. Patient alert and agreeable to PT session. Patient denied pain during session. ? ?In-person interpreter present throughout session.  ? ?Discussed patient's concerns about having to wait for staff to assist him to the bathroom. Patient's sister agreeable to be cleared to assist him to the bathroom. Demonstrated safe guarding with simulated ambulatory transfer to/from the bathroom. Patient misunderstood instructions following session and was ambulating with his sister seated across the room. Alerted by nursing and clarified instructions that his sister needs to be next to him to assist him to the bathroom as performed during session. Both the patient and his sister stated understanding and appreciated the clarification. LPN and Telesitter made aware of the sister's clearance for transfers following session, safety plan updated.  ? ?Focused session on community integration outside, establishing balance exercises for HEP, establishing salient tasks to incorporate exercise into a daily routine, and review of BI education handouts and d/c planning. Patient reports he is eager to go home and wants to have follow-up therapies. Discussed moving d/c up to Saturday with rehab team due to patient's progress, awaiting physician approval. Auria to provide patient with sources for therapy services  vis [facility] and [facility] PT programs.  ? ?Therapeutic Activity: ?Bed Mobility: Patient performed supine to/from sit independently. ?Transfers: Patient performed sit to/from stand independently from various surfaces throughout session.  ? ?Community Integration: ?Patient ambulated from his room, onto the elevator, to the [facility] entrance ambulating on unlevel side-walk and asphalt, across the street to the park, going up 8 steps without a rail, ambulating over grass, branches, and pine needles, descending a moderately steep hill, then ambulating to the atrium to rest. Patient then was able to return to his room without cues for navigation or selecting the correct floor to return to. Patient required CGA-close supervision and min A x2 due to minor LOB. Noted increased weaving with distraction or fatigue, but overall patient navigated well over various surfaces, curbs, and stairs. Patient demonstrated good safety awareness when crossing the street, looking both ways and waiting for cars to pass appropriately without cues. Educated patient on needing someone with him to guard him when ambulating in the community due to balance deficits at d/c, patient in agreement, however, demonstrates poor awareness of deficits and fall risk, stating this task was easy and he was not worried about falling.   ? ?Neuromuscular Re-ed: ?Patient performed the following standing balance activities in the // bars for safety: ?-tandem stance R/L x10 with single upper extremity support progressing to no support working toward increased time, counting out loud, without upper extremity support (1-6 sec) ?-feet together performed as above (8-30 sec) ?- SLS as above (L 1-8 sec, R 1-4 sec) ?-attempted Wii Baseball, as patient reported that he had a Wii at home and that he could perform this as activity, however, once playing the game the patient reported that he had a different  consol intended to be played in sitting; he did attempt to play the game, but had  poor frustration tolerance due to it being a novel task ? ?Patient in recliner with his sister in the room at end of session with breaks locked, no alarm set per clearance of patient's sister for transfers, see above, and all needs within reach.  ? ?Therapy Documentation ?Precautions:  ?Precautions ?Precautions: Fall ?Restrictions ?Weight Bearing Restrictions: No ?Agitated Behavior Scale: ?TBI ?Observation Details ?Observation Environment: [facility] ?Start of observation period - Date: 02/08/22 ?Start of observation period - Time: 1425 ?End of observation period - Date: 02/08/22 ?End of observation period - Time: [id number] ?Agitated Behavior Scale (DO NOT LEAVE BLANKS) ?Short attention span, easy distractibility, inability to concentrate: Present to a slight degree ?Impulsive, impatient, low tolerance for pain or frustration: Present to a slight degree ?Uncooperative, resistant to care, demanding: Present to a slight degree ?Violent and/or threatening violence toward people or property: Absent ?Explosive and/or unpredictable anger: Absent ?Rocking, rubbing, moaning, or other self-stimulating behavior: Absent ?Pulling at tubes, restraints, etc.: Absent ?Wandering from treatment areas: Absent ?Restlessness, pacing, excessive movement: Absent ?Repetitive behaviors, motor, and/or verbal: Absent ?Rapid, loud, or excessive talking: Absent ?Sudden changes of mood: Absent ?Easily initiated or excessive crying and/or laughter: Absent ?Self-abusiveness, physical and/or verbal: Absent ?Agitated behavior scale total score: 17 ? ? ? ?Therapy/Group: Individual Therapy ? ?Doreene Burke PT, DPT ? ?02/08/2022, 4:44 PM  ?

## 2022-02-09 ENCOUNTER — Other Ambulatory Visit (HOSPITAL_COMMUNITY): Payer: Self-pay

## 2022-02-09 MED ORDER — QUETIAPINE FUMARATE 50 MG PO TABS
100.0000 mg | ORAL_TABLET | Freq: Two times a day (BID) | ORAL | Status: DC
Start: 2022-02-09 — End: 2022-02-10
  Administered 2022-02-09 – 2022-02-10 (×2): 100 mg via ORAL
  Filled 2022-02-09 (×2): qty 2

## 2022-02-09 MED ORDER — OXYCODONE HCL 5 MG PO TABS
5.0000 mg | ORAL_TABLET | Freq: Four times a day (QID) | ORAL | 0 refills | Status: DC | PRN
  Filled 2022-02-09: qty 30, 8d supply, fill #0

## 2022-02-09 MED ORDER — OXYCODONE HCL 5 MG/5ML PO SOLN: 5.0000 mg | Freq: Four times a day (QID) | ORAL | Status: DC | PRN

## 2022-02-09 MED ORDER — OXYCODONE HCL 5 MG PO TABS: 5.0000 mg | ORAL_TABLET | Freq: Four times a day (QID) | ORAL | Status: DC | PRN

## 2022-02-09 MED ORDER — QUETIAPINE FUMARATE 100 MG PO TABS
100.0000 mg | ORAL_TABLET | Freq: Two times a day (BID) | ORAL | 0 refills | Status: DC
  Filled 2022-02-09: qty 28, 14d supply, fill #0

## 2022-02-09 MED ORDER — METHOCARBAMOL 500 MG PO TABS
1000.0000 mg | ORAL_TABLET | Freq: Three times a day (TID) | ORAL | 0 refills | Status: DC | PRN
  Filled 2022-02-09: qty 120, 20d supply, fill #0

## 2022-02-09 NOTE — Progress Notes (Signed)
Physical Therapy TBI Note ? ?Patient Details  ?Name: [facility] ?MRN: [id number] ?Date of Birth: 1996/07/23 ? ?Today's Date: 02/09/2022 ?PT Individual Time: 0017-4944 ?PT Individual Time Calculation (min): 75 min  ? ?Short Term Goals: ?Week 1:  PT Short Term Goal 1 (Week 1): Patient will perform formal balance test. ?PT Short Term Goal 2 (Week 1): Patient will perform basic transfers with supervision consistently. ?PT Short Term Goal 3 (Week 1): Patient will ambulate >200 feet without dizziness with CGA. ? ?Skilled Therapeutic Interventions/Progress Updates:  ?   ?Patient in the recliner upon PT arrival, requested time to finish eating a snack prior to PT session. Returned 10 min later and patient alert and agreeable to PT session. Patient denied pain during session. ? ?In-person interpreter present for 60 min of session, used [employer] interpretation system for the remaining 15 min. ? ?Patient perseverated on frustration with nursing staff and alarms, as patient has been non-compliant with getting up on his own. Provided education on patient's balance deficits and safety awareness. Patient demonstrates poor awareness of deficits with challenges understanding staff concerns for fall risk at this time. Focused session on balance training and assessments, see below.  ? ?Therapeutic Activity: ?Transfers: Patient performed sit to/from stand independently from various surfaces throughout session.  ? ?Gait Training:  ?Patient ambulated 50 feet x2 without an AD with supervision. Ambulated with improved balance and increased gait speed today demonstrating improved gait mechanics overall. Provided verbal cues for path finding to/from day room, which patient has been able to locate both during previous sessions. ? ?Neuromuscular Re-ed: ?Surveyor, mining and Mobility Scale ?1. UNILATERAL STANCE (R/L) ?0 unable to sustain ?1 2.00 to 4.49 sec. ?2 4.50 to 9.99 sec. ?3 10.00 to 19.99 sec. ?4 > 20.00 secs.  ?5 45.00 sec.,  steady and coordinated  ?2. TANDEM WALKING ?0 unable ?1 1 step ?2 2 to 3 consecutive steps ?3 > 3 consecutive steps ?4 > 3 consecutive steps ?5 7 consecutive steps ?3. 180? TANDEM PIVOT ?0 unable to sustain tandem stance ?1 sustains tandem stance but unable to unweight heels or initiate pivot ?2 initiates pivot but unable to complete 180? turn ?3 completes 180? turn but discontinuous pivot (e.g. pauses on toes) ?4 completes 180? turn in a continuous motion but can?t sustain reversed ?position ?5 completes 180? turn in a continuous motion and sustains reversed position ?4. LATERAL FOOT SCOOTING (R/L) ?0 unable ?1 1 lateral pivot  ?2 2 lateral pivots ?3 > 3 pivots but < 40 cm ?4 40 cm in any fashion and/or unable to control final position  ?5 40 cm continuous, rhythmical motion with controlled stop. ?5. HOPPING FORWARD (R/L) ?0 unable  ?1 1 to 2 hops, uncontrolled ?2 2 hops, controlled but unable to complete 1 metre ?3 1 metre in 2 hops but unable to sustain landing (touches down) ?4 1 metre in 2 hops but difficulty controlling landing (hops or pivots) ?5 1 metre in 2 hops, coordinated with stable landing ?6. CROUCH AND WALK ?0 unable to crouch ?1 able to descend only ?2 descends and rises but hesitates, unable to maintain forward momentum ?3 crouches and walks in continuous motion, time < 8.00 sec. protective step ?4 crouches and walks in continuous motion, time < 8.00 sec. excess ?equilibrium reaction ?5 crouches and walks in continuous motion, time < 4.00 sec. ?7. LATERAL DODGING ?0 unable to perform 1 cross-over in both directions without support ?1 1 cross-over in both directions in any fashion ?2 1 or  more cycles, but does not contact line every step ?3 2 cycles, contacts line every step ?4 2 cycles, contacts line every step 12.00 to 15.00 sec. ?5 2 cycles, contacts line every step < 12.00 sec. coordinated direction change ?8. WALKING & LOOKING (R/L) ?0 unable to walk and look e.g. stops ?1 performs but loses  visual fixation at or before 4 metre mark ?2 performs but loses visual fixation after 4 metre mark ?3 performs and maintains visual fixation between 2-6 metre mark but ?protective step ?4 performs and maintains visual fixation between 2-6 metre mark but veers  ?5 performs, straight path, steady and coordinated < 7.00 sec. ?9. RUNNING WITH CONTROLLED STOP ?0 unable to run ?1 runs, time > 5.00 sec. ?2 runs, time > 3.00 but < 5.00 sec., unable to control stop ?3 runs, time > 3.00 but < 5.00 sec., with controlled stop, both feet on line ?4 runs, time < 3.00 sec., unable to control stop ?5 runs, time < 3.00 sec., with controlled stop, both feet on line, coordinated ?and rhythmical ?10. FORWARD TO BACKWARD WALKING ?0 unable ?1 performs but must stop to regain balance ?2 performs with reduced speed, time > 11.00 sec. or requires 4 or more steps ?to turn ?3 performs in < 11.00 sec. and/or veers during backward walking ?4 performs in < 9.00 sec. and/or uses protective step during or just after turn ?5 performs in < 7.00 sec., maintains straight path ?11. WALK, LOOK AND CARRY (R/L) ?0 unable to walk and look e.g. stops ?1 performs but loses visual fixation at or before 4 metre mark ?2 performs but loses visual fixation after 4 metre mark ?3 performs and maintains visual fixation between 2-6 metre mark but ?protective step  ?4 performs and maintains visual fixation between 2-6 metre mark but veers ?5 performs, straight path, steady and coordinated < 7.00 sec. ?12. DESCENDING STAIRS ?0 unable to step down 1 step, or requires railing or assistance ?1 able to step down 1 step with/without cane ?2 able to step down 3 steps with/without cane, any pattern ?3 3 steps reciprocal or full ?ight in step-to pattern ?4 full flight reciprocal, awkward ?5 full flight reciprocal, rhythmical and coordinated ?+1 bonus for carrying basket ?13. STEP-UPS X 1 STEP (R/L) ?0 unable to step up, requires assistance or railing ?1 steps up, requires  assistance or railing to descend ?2 steps up and down (1 cycle) ?3 completes 5 cycles  ?4 completes 5 cycles in > 6.00 but < 10.00 sec. ?5 completes 5 cycles in < 6.00 sec., rhythmical ?TOTAL SCORE 40/96 = 41% ?Change score of 8 points or more in the patient is indicative of true change beyond measurement error. ?Healthy Age Related Reference Values: ?20-29 88.7 ?30-39 86.3 ?40-49 84.4 ?50-59 77.4 ?60-69 64.9 ?70-79 49.7 ? ?Berg Balance Test ?Sit to Stand: Able to stand without using hands and stabilize independently ?Standing Unsupported: Able to stand safely 2 minutes ?Sitting with Back Unsupported but Feet Supported on Floor or Stool: Able to sit safely and securely 2 minutes ?Stand to Sit: Sits safely with minimal use of hands ?Transfers: Able to transfer safely, minor use of hands ?Standing Unsupported with Eyes Closed: Able to stand 10 seconds safely ?Standing Ubsupported with Feet Together: Able to place feet together independently and stand 1 minute safely ?From Standing, Reach Forward with Outstretched Arm: Can reach confidently >25 cm (10") ?From Standing Position, Pick up Object from Floor: Able to pick up shoe safely and easily ?From Standing  Position, Turn to Look Behind Over each Shoulder: Looks behind from both sides and weight shifts well ?Turn 360 Degrees: Able to turn 360 degrees safely in 4 seconds or less ?Standing Unsupported, Alternately Place Feet on Step/Stool: Able to stand independently and safely and complete 8 steps in 20 seconds ?Standing Unsupported, One Foot in Front: Able to plae foot ahead of the other independently and hold 30 seconds ?Standing on One Leg: Able to lift leg independently and hold 5-10 seconds ?Total Score: 54/56 ?Patient demonstrated increased fall risk noted by score of 54/56 on the [employer] LLP[employer]erg Balance Scale.  ?<45/56 = fall risk, <42/56 = predictive of recurrent falls, <40/56 = 100% fall risk  ?>41 = independent, 21-40 = assistive device, 0-20 = wheelchair level  ?MDC 6.9  (4 pts 45-56, 5 pts 35-44, 7 pts 25-34) ?(ANPTA Core Set of Outcome Measures for Adults with Neurologic Conditions, 2018) ? ?Functional Gait  Assessment ?Gait Level Surface: Walks 20 ft in less than 5.5 sec,

## 2022-02-09 NOTE — Progress Notes (Signed)
Had a conversation this morning with pt and family about safe transfers and family & pt are still non-compliant. Removed tele and placed bed alarms on.  ? ?[place] ?  ?

## 2022-02-09 NOTE — Progress Notes (Signed)
Physical Therapy Discharge Summary ? ?Patient Details  ?Name: James Howell ?MRN: [id number] ?Date of Birth: 1996/01/05 ? ?Today's Date: 02/10/2022 ? ?Patient has met 10 of 11 long term goals due to improved activity tolerance, improved balance, improved postural control, ability to compensate for deficits, improved attention, improved awareness, and improved coordination.  Patient to discharge at an ambulatory level Supervision without an AD.   Patient's care partner is independent to provide the necessary cognitive assistance at discharge. ? 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 Family has been educated on this and overall deficits are improving minimizing safety risk with mobility.  ? ?Recommendation:  ?Patient will benefit from ongoing skilled PT services in outpatient setting to continue to advance safe functional mobility, address ongoing impairments in balance, activity tolerance, functional mobility, dynamic gait, dual task training, community integration, patient/caregiver education, and minimize fall risk. ? ?Equipment: ?No equipment provided ? ?Reasons for discharge: treatment goals met ? ?Patient/family agrees with progress made and goals achieved: Yes ? ?PT Discharge ?Precautions/Restrictions ?Precautions ?Precautions: Fall ?Precaution Comments: 24/7 supervision ?Restrictions ?Weight Bearing Restrictions: No ?Pain Interference ?Pain Interference ?Pain Effect on Sleep: 0. Does not apply - I have not had any pain or hurting in the past 5 days ?Pain Interference with Therapy Activities: 1. Rarely or not at all ?Pain Interference with Day-to-Day Activities: 1. Rarely or not at all ?Vision/Perception  ?Vision - History ?Ability to See in Adequate Light: 0 Adequate ?Vision - Assessment ?Eye Alignment: Within Functional Limits ?Ocular Range of Motion: Within Functional  Limits ?Alignment/Gaze Preference: Within Defined Limits ?Tracking/Visual Pursuits: Decreased smoothness of horizontal tracking;Decreased smoothness of vertical tracking;Decreased smoothness of eye movement to LEFT inferior field;Decreased smoothness of eye movement to LEFT superior field ?Convergence: Within functional limits ?Perception ?Perception: Impaired ?Inattention/Neglect: Does not attend to left visual field ?Praxis ?Praxis: Intact  ?Cognition ?Overall Cognitive Status: Impaired/Different from baseline ?Arousal/Alertness: Awake/alert ?Focused Attention: Appears intact ?Sustained Attention: Appears intact ?Selective Attention: Impaired ?Selective Attention Impairment: Functional complex ?Memory: Impaired ?Awareness: Impaired ?Awareness Impairment: Emergent impairment ?Problem Solving: Impaired ?Problem Solving Impairment: Functional complex ?Behaviors: Impulsive ?Safety/Judgment: Impaired ?[employer] Scales of Cognitive Functioning: Automatic/appropriate ?Sensation ?Sensation ?Light Touch: Appears Intact ?Hot/Cold: Appears Intact ?Proprioception: Impaired by gross assessment ?Stereognosis: Not tested ?Coordination ?Gross Motor Movements are Fluid and Coordinated: No ?Fine Motor Movements are Fluid and Coordinated: Yes ?Coordination and Movement Description: lower limb ataxia with gait, decreased postural control (improving since admission) ?Motor  ?Motor ?Motor: Ataxia;Abnormal postural alignment and control  ?Mobility ?Bed Mobility ?Bed Mobility: Rolling Right;Rolling Left;Sit to Supine;Supine to Sit ?Rolling Right: Independent ?Rolling Left: Independent ?Supine to Sit: Independent ?Sit to Supine: Independent ?Transfers ?Transfers: Sit to Stand;Stand to [employer] Transfers ?Sit to Stand: Independent ?Stand to Sit: Independent ?Stand Pivot Transfers: Independent ?Transfer (Assistive device): None ?Locomotion  ?Gait ?Ambulation: Yes ?Gait Assistance: Independent ?Gait Distance (Feet): 1461  Feet ?Assistive device: None ?Gait ?Gait: Yes ?Gait Pattern: Within Functional Limits ?Stairs / Additional Locomotion ?Stairs: Yes ?Stairs Assistance: Supervision/Verbal cueing ?Stair Management Technique: No rails ?Number of Stairs: 12 ?Height of Stairs: 6 ?Ramp: Supervision/Verbal cueing ?Curb: Supervision/Verbal cueing ?Pick up small object from the floor assist level: Independent ?Wheelchair Mobility ?Wheelchair Mobility: No  ?Trunk/Postural Assessment  ?Cervical Assessment ?Cervical Assessment: Within Functional Limits ?Thoracic Assessment ?Thoracic Assessment: Within Functional Limits ?Lumbar Assessment ?Lumbar Assessment: Within Functional Limits ?Postural Control ?Postural Control: Deficits on evaluation (delayed)  ?  Balance ?Standardized Balance Assessment ?Standardized Balance Assessment: Merrilee Jansky Balance Test;Functional Gait Assessment ?Berg Balance Test ?Sit to Stand: Able to stand without using hands and stabilize independently ?Standing Unsupported: Able to stand safely 2 minutes ?Sitting with Back Unsupported but Feet Supported on Floor or Stool: Able to sit safely and securely 2 minutes ?Stand to Sit: Sits safely with minimal use of hands ?Transfers: Able to transfer safely, minor use of hands ?Standing Unsupported with Eyes Closed: Able to stand 10 seconds safely ?Standing Ubsupported with Feet Together: Able to place feet together independently and stand 1 minute safely ?From Standing, Reach Forward with Outstretched Arm: Can reach confidently >25 cm (10") ?From Standing Position, Pick up Object from Floor: Able to pick up shoe safely and easily ?From Standing Position, Turn to Look Behind Over each Shoulder: Looks behind from both sides and weight shifts well ?Turn 360 Degrees: Able to turn 360 degrees safely in 4 seconds or less ?Standing Unsupported, Alternately Place Feet on Step/Stool: Able to stand independently and safely and complete 8 steps in 20 seconds ?Standing Unsupported, One Foot in  Front: Able to plae foot ahead of the other independently and hold 30 seconds ?Standing on One Leg: Able to lift leg independently and hold 5-10 seconds ?Total Score: 54 ?Static Sitting Balance ?Static Sitting - Level of Assistance: 7: Independent ?Dynamic Sitting Balance ?Dynamic Sitting - Level of Assistance: 7: Independent ?Static Standing Balance ?Static Standing - Level of Assistance: 7: Independent ?Dynamic Standing Balance ?Dynamic Standing - Level of Assistance: 7: Independent ?Functional Gait  Assessment ?Gait Level Surface: Walks 20 ft in less than 5.5 sec, no assistive devices, good speed, no evidence for imbalance, normal gait pattern, deviates no more than 6 in outside of the 12 in walkway width. ?Change in Gait Speed: Makes only minor adjustments to walking speed, or accomplishes a change in speed with significant gait deviations, deviates 10-15 in outside the 12 in walkway width, or changes speed but loses balance but is able to recover and continue walking. ?Gait with Horizontal Head Turns: Performs head turns smoothly with slight change in gait velocity (eg, minor disruption to smooth gait path), deviates 6-10 in outside 12 in walkway width, or uses an assistive device. ?Gait with Vertical Head Turns: Performs task with slight change in gait velocity (eg, minor disruption to smooth gait path), deviates 6 - 10 in outside 12 in walkway width or uses assistive device ?Gait and Pivot Turn: Pivot turns safely in greater than 3 sec and stops with no loss of balance, or pivot turns safely within 3 sec and stops with mild imbalance, requires small steps to catch balance. ?Step Over Obstacle: Is able to step over 2 stacked shoe boxes taped together (9 in total height) without changing gait speed. No evidence of imbalance. ?Gait with Narrow Base of Support: Ambulates 4-7 steps. ?Gait with Eyes Closed: Walks 20 ft, uses assistive device, slower speed, mild gait deviations, deviates 6-10 in outside 12 in  walkway width. Ambulates 20 ft in less than 9 sec but greater than 7 sec. ?Ambulating Backwards: Walks 20 ft, no assistive devices, good speed, no evidence for imbalance, normal gait ?Steps: Alternating feet, no rail. ?T

## 2022-02-09 NOTE — Progress Notes (Signed)
Occupational Therapy TBI Note ? ?Patient Details  ?Name: James Howell ?MRN: [id number] ?Date of Birth: 02/11/1996 ? ?Today's Date: 02/09/2022 ?OT Individual Time: 1102-1200 ?OT Individual Time Calculation (min): 58 min  ? ? ?Short Term Goals: ?Week 1:  OT Short Term Goal 1 (Week 1): Pt will ambulate to bathroom and complete toilet transfer with supervision. ?OT Short Term Goal 2 (Week 1): Pt will complete UB/LB bathing at shower level with supervision. ?OT Short Term Goal 3 (Week 1): Pt will complete tub shower transfer with supervision with DME as needed. ?OT Short Term Goal 4 (Week 1): Pt will complete 3/3 toileting with supervision. ? ?Skilled Therapeutic Interventions/Progress Updates:  ?  Patient greeted sitting in recliner with family present, agreeable to OT treatment session. Patient ambulated to gym mod I and addressed dynamic balance using [employer]. Patient with difficulty understanding left and right and difficulty understanding foot placement in middle of platform. Once center of balance was achieved, OT upgraded task from static balance to dynamic level 11. Focus on hip and ankle strategies within balance task with improved control after multiple trials. Dual task activity along with balance, ambulating in hallway with ball on saucer. Had pt count backwards and state months of the year backwards. Ambulated outside and worked on path finding his way back inside while discussing barriers to safety and community environment. Patient able to find his way back to his room without cues. Continue dynamic balance challenge while tossing softball sized ball in hallway, graded activity by ambulating forwards and backwards, as well as catching ball over shoulder and to the side. Patient with occasional loss of balance laterally when stepping forward to toss ball, but able to correct balance. Utilized BITS for cognition and problem-solving with trail making task alternating letters and numbers in order. Pt had  difficulty initially understanding concept and needed demonstration and moderate cues to compelet with 60% accuracy.Pt ambulated back to room mod I and left seated in recliner with family present and needs met.  ? ?Therapy Documentation ?Precautions:  ?Precautions ?Precautions: Fall ?Precaution Comments: 24/7 supervision ?Restrictions ?Weight Bearing Restrictions: No ?Pain: ? Denies pain ?Agitated Behavior Scale: ?TBI ?Observation Details ?Observation Environment: CIR ?Start of observation period - Date: 02/09/22 ?Start of observation period - Time: 0918 ?End of observation period - Date: 02/09/22 ?End of observation period - Time: 1013 ?Agitated Behavior Scale (DO NOT LEAVE BLANKS) ?Short attention span, easy distractibility, inability to concentrate: Present to a slight degree ?Impulsive, impatient, low tolerance for pain or frustration: Present to a slight degree ?Uncooperative, resistant to care, demanding: Present to a slight degree ?Violent and/or threatening violence toward people or property: Absent ?Explosive and/or unpredictable anger: Absent ?Rocking, rubbing, moaning, or other self-stimulating behavior: Absent ?Pulling at tubes, restraints, etc.: Absent ?Wandering from treatment areas: Absent ?Restlessness, pacing, excessive movement: Absent ?Repetitive behaviors, motor, and/or verbal: Absent ?Rapid, loud, or excessive talking: Present to a slight degree ?Sudden changes of mood: Absent ?Easily initiated or excessive crying and/or laughter: Absent ?Self-abusiveness, physical and/or verbal: Absent ?Agitated behavior scale total score: 18 ? ? ? ?Therapy/Group: Individual Therapy ? ? S  ?02/09/2022, 3:03 PM ?
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
## 2022-02-09 NOTE — Progress Notes (Signed)
Speech Language Pathology TBI Note ? ?Patient Details  ?Name: James Howell ?MRN: [id number] ?Date of Birth: [birth year] ? ?Today's Date: 02/09/2022 ?SLP Individual Time: 9628-3662 ?SLP Individual Time Calculation (min): 55 min ? ?Short Term Goals: ?Week 1: SLP Short Term Goal 1 (Week 1): Pt will use compensatory aids for recall of important information with minA verbal cues. ?SLP Short Term Goal 2 (Week 1): Pt will sustain attention to therapeutic task >10 minutes with minA verbal cues. ?SLP Short Term Goal 3 (Week 1): Pt will identify at least 2 changes s/p CVA and how they impact safety with modA verbal cues. ?SLP Short Term Goal 4 (Week 1): Pt will solve mildly complex problems with minA verbal cues. ? ?Skilled Therapeutic Interventions: Skilled treatment session focused on cognitive goals. Upon arrival, patient was awake while upright in the recliner and agreeable to treatment session. SLP facilitated session by providing a list of places for patient to locate/navigate on the unit with Mod A verbal cues needed for use of a memory compensatory strategy to assist in recall. Patient mildly frustrated throughout task due to language barrier (despite having an interpreter) making it difficult for patient to utilize signs or ask for assistance. With Mod question cues, patient reported that he would use an interpreting app on his phone to ask for assistance when he is out in the community. SLP provided education that that is an appropriate solution but patient remained frustrated since the SLP "didn't tell me I could do that." SLP explained that the task is to assess patient's ability to utilize memory strategies and problem solve through functional situations, patient verbalized understanding. Patient completed the reminder of the task with supervision. SLP also facilitated session by providing education regarding memory compensatory strategies. Patient verbalized understanding and required Min verbal cues for  how to implement strategies at home. Patient left upright in recliner with family present. Continue with current plan of care.  ?   ? ?Pain ?No/Denies Pain  ? ?Agitated Behavior Scale: ?TBI ?Observation Details ?Observation Environment: CIR ?Start of observation period - Date: 02/09/22 ?Start of observation period - Time: 0918 ?End of observation period - Date: 02/09/22 ?End of observation period - Time: 1013 ?Agitated Behavior Scale (DO NOT LEAVE BLANKS) ?Short attention span, easy distractibility, inability to concentrate: Present to a slight degree ?Impulsive, impatient, low tolerance for pain or frustration: Present to a slight degree ?Uncooperative, resistant to care, demanding: Present to a slight degree ?Violent and/or threatening violence toward people or property: Absent ?Explosive and/or unpredictable anger: Absent ?Rocking, rubbing, moaning, or other self-stimulating behavior: Absent ?Pulling at tubes, restraints, etc.: Absent ?Wandering from treatment areas: Absent ?Restlessness, pacing, excessive movement: Absent ?Repetitive behaviors, motor, and/or verbal: Absent ?Rapid, loud, or excessive talking: Present to a slight degree ?Sudden changes of mood: Absent ?Easily initiated or excessive crying and/or laughter: Absent ?Self-abusiveness, physical and/or verbal: Absent ?Agitated behavior scale total score: 18 ? ?Therapy/Group: Individual Therapy ? ?James Howell ?02/09/2022, 12:39 PM ?

## 2022-02-09 NOTE — Progress Notes (Signed)
Patient ID: James Howell, male   DOB: 06/02/1996, 26 y.o.   MRN: [id number] ? ?SW received updates from medical team that pt is making gains and he can discharge on Saturday (5/13). SW called pt partner James Howell with [facility] James Howell PP#[id number] to discuss above. She is agreeable to Saturday d/c. SW discussed outpatient therapies and hospital follow-up--and process with regards to requesting financial assistance. She is aware outpatient referral sent to [facility]. SW dicussed [facility] medication assistance program. SW informed will review everything with her tomorrow during family education (1pm-3:30pm). ? ?Cecile Sheerer, MSW, LCSWA ?Office: [phone] ?Cell: [phone] ?Fax: [phone]  ?

## 2022-02-10 ENCOUNTER — Inpatient Hospital Stay (HOSPITAL_COMMUNITY): Payer: Self-pay

## 2022-02-10 ENCOUNTER — Other Ambulatory Visit (HOSPITAL_COMMUNITY): Payer: Self-pay

## 2022-02-10 MED ORDER — METHOCARBAMOL 500 MG PO TABS: 500.0000 mg | ORAL_TABLET | Freq: Four times a day (QID) | ORAL | Status: DC | PRN

## 2022-02-10 MED ORDER — QUETIAPINE FUMARATE 100 MG PO TABS
100.0000 mg | ORAL_TABLET | Freq: Every day | ORAL | 1 refills | Status: DC
  Filled 2022-02-10: qty 30, 30d supply, fill #0

## 2022-02-10 MED ORDER — QUETIAPINE FUMARATE 50 MG PO TABS
100.0000 mg | ORAL_TABLET | Freq: Every day | ORAL | Status: DC
Start: 2022-02-11 — End: 2022-02-11

## 2022-02-10 MED ORDER — METHOCARBAMOL 500 MG PO TABS
500.0000 mg | ORAL_TABLET | Freq: Three times a day (TID) | ORAL | 0 refills | Status: DC | PRN
  Filled 2022-02-10: qty 90, 30d supply, fill #0

## 2022-02-10 NOTE — Plan of Care (Signed)
?  Problem: RH Grooming °Goal: LTG Patient will perform grooming w/assist,cues/equip (OT) °Description: LTG: Patient will perform grooming with assist, with/without cues using equipment (OT) °Outcome: Completed/Met °  °Problem: RH Bathing °Goal: LTG Patient will bathe all body parts with assist levels (OT) °Description: LTG: Patient will bathe all body parts with assist levels (OT) °Outcome: Completed/Met °  °Problem: RH Dressing °Goal: LTG Patient will perform upper body dressing (OT) °Description: LTG Patient will perform upper body dressing with assist, with/without cues (OT). °Outcome: Completed/Met °Goal: LTG Patient will perform lower body dressing w/assist (OT) °Description: LTG: Patient will perform lower body dressing with assist, with/without cues in positioning using equipment (OT) °Outcome: Completed/Met °  °Problem: RH Toileting °Goal: LTG Patient will perform toileting task (3/3 steps) with assistance level (OT) °Description: LTG: Patient will perform toileting task (3/3 steps) with assistance level (OT)  °Outcome: Completed/Met °  °Problem: RH Toilet Transfers °Goal: LTG Patient will perform toilet transfers w/assist (OT) °Description: LTG: Patient will perform toilet transfers with assist, with/without cues using equipment (OT) °Outcome: Completed/Met °  °Problem: RH Tub/Shower Transfers °Goal: LTG Patient will perform tub/shower transfers w/assist (OT) °Description: LTG: Patient will perform tub/shower transfers with assist, with/without cues using equipment (OT) °Outcome: Completed/Met °  °

## 2022-02-10 NOTE — Progress Notes (Signed)
Inpatient Rehabilitation Discharge Medication Review by a Pharmacist ? ?A complete drug regimen review was completed for this patient to identify any potential clinically significant medication issues. ? ?High Risk Drug Classes Is patient taking? Indication by Medication  ?Antipsychotic Yes Seroquel for mood, TBI  ?Anticoagulant No   ?Antibiotic No   ?Opioid Yes Oxycodone prn pain  ?Antiplatelet No   ?Hypoglycemics/insulin No   ?Vasoactive Medication No   ?Chemotherapy No   ?Other Yes Robaxin prn spasms  ? ? ? ?Type of Medication Issue Identified Description of Issue Recommendation(s)  ?Drug Interaction(s) (clinically significant) ?    ?Duplicate Therapy ?    ?Allergy ?    ?No Medication Administration End Date ?    ?Incorrect Dose ?    ?Additional Drug Therapy Needed ?    ?Significant med changes from prior encounter (inform family/care partners about these prior to discharge).    ?Other ?    ? ? ?Clinically significant medication issues were identified that warrant physician communication and completion of prescribed/recommended actions by midnight of the next day:  No ? ?Pharmacist comments: None ? ?Time spent performing this drug regimen review (minutes):  20 minutes ? ? ?James Howell ?02/10/2022 9:24 AM ?
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
## 2022-02-10 NOTE — Progress Notes (Signed)
Dictation on: 02/10/2022  5:21 PM by: Ranelle Oyster [[id number]]  ? ?

## 2022-02-10 NOTE — Progress Notes (Signed)
Physical Therapy Session Note ? ?Patient Details  ?Name: [facility] ?MRN: [id number] ?Date of Birth: May 29, 1996 ? ?Today's Date: 02/10/2022 ?PT Individual Time: 1105-1200 and 1345-1425 ?PT Individual Time Calculation (min): 55 min and 40 min ? ?Short Term Goals: ?Week 1:  PT Short Term Goal 1 (Week 1): Patient will perform formal balance test. ?PT Short Term Goal 2 (Week 1): Patient will perform basic transfers with supervision consistently. ?PT Short Term Goal 3 (Week 1): Patient will ambulate >200 feet without dizziness with CGA. ? ?Skilled Therapeutic Interventions/Progress Updates:  ?   ?Pt received supine in bed and agrees to therapy. No complaint of pain. Bed mobility independent. Pt performs sit to stand with CGA and ambulates to restroom for toileting. Pt then ambulates to gym and completes x12 6" steps without hand rails and with close supervision for safety. Seated rest break. PT provides education on 6 Minute Walk Test and pt completes with score of 1461'. PT provides pt with education on HEP and pt practices rhomberg stance in corner for safety, single leg stance with occasional upper extremity support, x10 reps of sit to stand with arms folded across chest, and x10 reps with tandem stance for narrower BOS and increased challenge. PT educates on importance of limiting distractions with mobility. Pt ambulates back to room. Left seated with all needs within reach. ? ?2nd Session: Pt received seated in chair and agrees to therapy. No complaint of pain. Family present for family education. PT provides education on brain injury, importance of safe mobility and supervision from family when ambulating, especially when outside, expectations for functional recovery, importance of smoking and drinking cessation. Pt ambulates go gym with cues for navigation. Pt performs floor transfer x2 with verbal cues for sequencing and safety. Pt ambulates to dayroom. Pt completes Wii bowling activity while standing on  airex mat to provide unreliable somatosensory input and challenge dynamic standing balance. Pt ambulates back to room. Left seated with all needs within reach. ? ?Therapy Documentation ?Precautions:  ?Precautions ?Precautions: Fall ?Precaution Comments: 24/7 supervision ?Restrictions ?Weight Bearing Restrictions: No ? ? ? ?Therapy/Group: Individual Therapy ? ?Breck Coons, PT, DPT ?02/10/2022, 4:58 PM  ?

## 2022-02-10 NOTE — Progress Notes (Signed)
Pt complained of left wrist and knee pain today. Struggled with pushing self up off ground with therapy today. Ordered left knee and wrist X-rays. Films reviewed: no evidence of fracture or obvious abnormality.  ? ?Advise prn ice, NSAID?s after discharge. Can go home with a few oxycodone if pain is severe.  ? ?Ivory Broad MD ?

## 2022-02-10 NOTE — Progress Notes (Incomplete Revision)
Inpatient Rehabilitation Care Coordinator ?Discharge Note  ? ?Patient Details  ?Name: [facility] ?MRN: [id number] ?Date of Birth: [birth year] ? ? ?Discharge location: D/c to home with his partner [name] ? ?Length of Stay: 5 days ? ?Discharge activity level: Supervision ? ?Home/community participation: Limited ? ?Patient response WN:IOEVOJ Literacy - How often do you need to have someone help you when you read instructions, pamphlets, or other written material from your doctor or pharmacy?: Never ? ?Patient response JK:KXFGHW Isolation - How often do you feel lonely or isolated from those around you?: Patient unable to respond ? ?Services provided included: MD, RD, PT, OT, RN, Pharmacy, Neuropsych, SW, TR, CM, SLP ? ?Financial Services:  ?Field seismologist Utilized: Other (Comment) (self pay; only eligible for emergency medicaid) ? ?Choices offered to/list presented to: Yes ? ?Follow-up services arranged:  ?Outpatient ?   ?Outpatient Servicies: [facility] for PT/OT/SLP ? ?Patient response to transportation need: ?Is the patient able to respond to transportation needs?: Yes ?In the past 12 months, has lack of transportation kept you from medical appointments or from getting medications?: No ?In the past 12 months, has lack of transportation kept you from meetings, work, or from getting things needed for daily living?: No ? ?Comments (or additional information): Pt set up for hospital follow-up with [facility] on 5/24 at 2pm with Ricky Stabs.  ? ?Patient/Family verbalized understanding of follow-up arrangements:  Yes ? ?Individual responsible for coordination of the follow-up plan: contact pt partner [name] (spanish interpreter required) ? ?Confirmed correct DME delivered: Gretchen Short 02/10/2022   ? ?Gretchen Short ?

## 2022-02-10 NOTE — Progress Notes (Signed)
Occupational Therapy TBI Note ? ?Patient Details  ?Name: [facility] ?MRN: [id number] ?Date of Birth: 1996-01-11 ? ?Today's Date: 02/10/2022 ?OT Individual Time: 4332-9518 ?OT Individual Time Calculation (min): 58 min  ? ?Short Term Goals: ?Week 1:  OT Short Term Goal 1 (Week 1): Pt will ambulate to bathroom and complete toilet transfer with supervision. ?OT Short Term Goal 2 (Week 1): Pt will complete UB/LB bathing at shower level with supervision. ?OT Short Term Goal 3 (Week 1): Pt will complete tub shower transfer with supervision with DME as needed. ?OT Short Term Goal 4 (Week 1): Pt will complete 3/3 toileting with supervision. ? ?Skilled Therapeutic Interventions/Progress Updates:  ?   Pt greeted seated in recliner with significant other present and agreeable to OT treatment session. No interpreter available so [employer] video interpreter used. Pt declined need to shower today, but pt stated he has showered with significant other and he has not needed her help, although she is there with him. Pt ambulated to therapy gym mod I. Addressed core strength, UB strengthening, and hand eye coordination. With pt seated on Bosu ball, pt completed beach ball volley using 4 lb dowel rod reaching in all quadrants and able to correct balance with core. Seated ball toss overhead and chest pass with oblique twist, standing front squats down to bosu ball. 3 sets of 20. Pt then ambulated to day room mod I and completed 10 minutes on NuStep on level 5 for LB strength and endurance. Pt returned to room, completed ambulatory bathroom transfer, voided bladder, and washed hands mod I. Pt left seated in recliner with needs met.  ? ?Therapy Documentation ?Precautions:  ?Precautions ?Precautions: Fall ?Precaution Comments: 24/7 supervision ?Restrictions ?Weight Bearing Restrictions: No ?Pain: ? Denies pain ?Agitated Behavior Scale: ?TBI ?Observation Details ?Observation Environment: CIR ?Start of observation period - Date:  02/10/22 ?Start of observation period - Time: 0917 ?End of observation period - Date: 02/10/22 ?End of observation period - Time: 1015 ?Agitated Behavior Scale (DO NOT LEAVE BLANKS) ?Short attention span, easy distractibility, inability to concentrate: Present to a slight degree ?Impulsive, impatient, low tolerance for pain or frustration: Present to a slight degree ?Uncooperative, resistant to care, demanding: Absent ?Violent and/or threatening violence toward people or property: Absent ?Explosive and/or unpredictable anger: Absent ?Rocking, rubbing, moaning, or other self-stimulating behavior: Absent ?Pulling at tubes, restraints, etc.: Absent ?Wandering from treatment areas: Absent ?Restlessness, pacing, excessive movement: Absent ?Repetitive behaviors, motor, and/or verbal: Absent ?Rapid, loud, or excessive talking: Absent ?Sudden changes of mood: Absent ?Easily initiated or excessive crying and/or laughter: Absent ?Self-abusiveness, physical and/or verbal: Absent ?Agitated behavior scale total score: 16 ? ?Therapy/Group: Individual Therapy ? ?James Howell ?02/10/2022, 1:51 PM ?

## 2022-02-10 NOTE — Progress Notes (Addendum)
Inpatient Rehabilitation Care Coordinator ?Discharge Note  ? ?Patient Details  ?Name: James Howell ?MRN: [id number] ?Date of Birth: 09/30/1996 ? ? ?Discharge location: D/c to home with his partner James Howell ? ?Length of Stay: 5 days ? ?Discharge activity level: Supervision ? ?Home/community participation: Limited ? ?Patient response to:Health Literacy - How often do you need to have someone help you when you read instructions, pamphlets, or other written material from your doctor or pharmacy?: Never ? ?Patient response to:Social Isolation - How often do you feel lonely or isolated from those around you?: Patient unable to respond ? ?Services provided included: MD, RD, PT, OT, RN, Pharmacy, Neuropsych, SW, TR, CM, SLP ? ?Financial Services:  ?Financial Services Utilized: Other (Comment) (self pay; only eligible for emergency medicaid) ? ?Choices offered to/list presented to: Yes ? ?Follow-up services arranged:  ?Outpatient ?   ?Outpatient Servicies: [facility] for PT/OT/SLP ? ?Patient response to transportation need: ?Is the patient able to respond to transportation needs?: Yes ?In the past 12 months, has lack of transportation kept you from medical appointments or from getting medications?: No ?In the past 12 months, has lack of transportation kept you from meetings, work, or from getting things needed for daily living?: No ? ?Comments (or additional information): Pt set up for hospital follow-up with [place] on 5/24 at 2pm with James Howell.  ? ?Patient/Family verbalized understanding of follow-up arrangements:  Yes ? ?Individual responsible for coordination of the follow-up plan: contact pt partner James Howell (spanish interpreter required) ? ?Confirmed correct DME delivered: James Howell 02/10/2022   ? ?James Howell ?

## 2022-02-10 NOTE — Progress Notes (Addendum)
Occupational Therapy Discharge Summary  Patient Details  Name: James Howell MRN: [id number] Date of Birth: 07-31-1996  Today's Date: 02/10/2022 OT Individual Time: 5498-2641 OT Individual Time Calculation (min): 45 min   OT treatment session focused on pt/family education. OT provided TBI education handouts in spanish regarding emotional changes after TBI, anger, parenting with TBI, and general TBI education. Reviewed those with family. Educated on safety within BADL tasks in home environment with family verbalizing understanding. [employer] Video Interpreter used. Pt ambulated to therapy gym and worked on core strength and UE strength with quadruped clothes pin task. Pt reported pain in L wrist and L knee when weightbearing. Pt requested an x-ray be done on both. OT relayed this information to MD. PT then ambulated to [place] and worked on L visual scanning and reaction time. Pt with 89% accuracy with L lower quadrant slightly slower. Pt returned to room and left seated in wc with family present and needs met . Patient has met 7 of 7 long term goals due to improved activity tolerance, improved balance, postural control, ability to compensate for deficits, improved attention, improved awareness, and improved coordination.  Patient to discharge at overall Modified Independent level.  Patient's care partner is independent to provide the necessary cognitive assistance at discharge.    Reasons goals not met: n/a  Recommendation:  Patient will benefit from ongoing skilled OT services in outpatient setting to continue to advance functional skills in the area of BADL and cognitive retraining .  Equipment: No equipment provided  Reasons for discharge: treatment goals met and discharge from hospital  Patient/family agrees with progress made and goals achieved: Yes  OT Discharge Precautions/Restrictions  Precautions Precautions: Fall Restrictions Weight Bearing Restrictions: No Pain  Denies  pain ADL ADL Eating: Independent Grooming: Independent Upper Body Bathing: Independent Lower Body Bathing: Modified independent Upper Body Dressing: Independent Where Assessed-Upper Body Dressing: Sitting at sink Lower Body Dressing: Independent Where Assessed-Lower Body Dressing: Standing at sink, Sitting at sink Toileting: Independent Toilet Transfer: Independent Toilet Transfer Method: Ambulating Tub/Shower Transfer: Modified independent Perception  Perception: Impaired Inattention/Neglect: Other (comment) (Mild L visual field deficits, much improved since eval) Cognition Cognition Overall Cognitive Status: Impaired/Different from baseline Arousal/Alertness: Awake/alert Orientation Level: Person;Place;Situation Memory: Impaired Focused Attention: Appears intact Sustained Attention: Appears intact Selective Attention: Impaired Selective Attention Impairment: Functional complex Awareness: Impaired Awareness Impairment: Emergent impairment Problem Solving: Impaired Problem Solving Impairment: Functional complex Behaviors: Impulsive Safety/Judgment: Impaired Rancho [employer] Scales of Cognitive Functioning: Automatic/appropriate Brief Interview for Mental Status (BIMS) Repetition of Three Words (First Attempt): 3 Temporal Orientation: Year: Correct Temporal Orientation: Month: Accurate within 5 days Temporal Orientation: Day: Correct Recall: "Sock": Yes, no cue required Recall: "Blue": Yes, no cue required Recall: "Bed": Yes, no cue required BIMS Summary Score: 15 Sensation Coordination Gross Motor Movements are Fluid and Coordinated: No Fine Motor Movements are Fluid and Coordinated: Yes Coordination and Movement Description: lower limb ataxia with gait, decreased postural control (improving since admission) Motor  Motor Motor - Discharge Observations: ataxia improved since eval Mobility  Bed Mobility Rolling Right: Independent Rolling Left: Independent Supine  to Sit: Independent Sit to Supine: Independent Transfers Sit to Stand: Independent Stand to Sit: Independent  Balance Static Sitting Balance Static Sitting - Balance Support: Feet supported;No upper extremity supported Static Sitting - Level of Assistance: 7: Independent Dynamic Sitting Balance Dynamic Sitting - Balance Support: Feet supported;No upper extremity supported Dynamic Sitting - Level of Assistance: 7: Independent Static Standing Balance Static Standing - Balance Support:  During functional activity;No upper extremity supported Static Standing - Level of Assistance: 7: Independent Dynamic Standing Balance Dynamic Standing - Balance Support: No upper extremity supported;During functional activity Dynamic Standing - Level of Assistance: 7: Independent Extremity/Trunk Assessment RUE Assessment RUE Assessment: Within Functional Limits LUE Assessment LUE Assessment: Within Functional Limits General Strength Comments: Reported some mild pain with wieght bearing through L wrist   Daneen Schick Nolawi Kanady 02/10/2022, 3:23 PM

## 2022-02-10 NOTE — Progress Notes (Signed)
Speech Language Pathology Discharge Summary ? ?Patient Details  ?Name: [facility] ?MRN: [id number] ?Date of Birth: October 09, 1995 ? ?Today's Date: 02/10/2022 ?SLP Individual Time: 0110-0349 ?SLP Individual Time Calculation (min): 24 min ? ? ?Skilled Therapeutic Interventions:  Skilled treatment session focused on completion of education with the patient and his sister. SLP facilitated session by providing education regarding patient's current cognitive deficits and strategies to utilize at home to maximize recall, problem solving and overall safety. SLP discussed the importance of 24 hour supervision, managing amount of visitors when at home and provided a verbal list of tasks/activities that are not safe to participate in at this time. Both verbalized understanding and handouts were given to reinforce information. Patient left upright in wheelchair with sister present.   ? ?Patient has met 4 of 4 long term goals.  Patient to discharge at [facility] level.  ? ?Reasons goals not met: N/A  ? ?Clinical Impression/Discharge Summary: Patient has made functional gains and has met 4 of 4 LTGs this admission. Currently, patient demonstrates behaviors consistent with a Rancho Level VII and requires overall Min A multimodal cues to complete functional and familiar task safely in regards to functional problem solving, recall of daily information, sustained attention and emergent awareness of errors. Patient and family education is complete and patient will discharge home with 24 hour supervision from family. Patient would benefit from f/u SLP services to maximize his cognitive functioning and overall functional independence in order to reduce caregiver burden.   ? ?Care Partner:  ?Caregiver Able to Provide Assistance: Yes  ?Type of Caregiver Assistance: Cognitive;Physical ? ?Recommendation:  ?Outpatient SLP;24 hour supervision/assistance  ?Rationale for SLP Follow Up: Reduce caregiver burden  ? ?Equipment: N/A  ? ?Reasons  for discharge: Discharged from hospital;Treatment goals met  ? ?Patient/Family Agrees with Progress Made and Goals Achieved: Yes  ? ? ?Brodi Nery ?02/10/2022, 6:40 AM ? ?

## 2022-02-10 NOTE — Progress Notes (Signed)
Patient ID: James Howell, male   DOB: 07/14/1996, 26 y.o.   MRN: [id number] ? ?SW met with pt, pt partner [name], PA-Dan, and RN-Fabiola who assisted with interpretation for pt and family. SW reviewed discharge instructions, appointment card, and follow-up therapies. No questions/concerns reported.  ? ? , MSW, LCSWA ?Office: [phone] ?Cell: [phone] ?Fax: [phone]  ?

## 2022-02-11 DIAGNOSIS — S069XAD Unspecified intracranial injury with loss of consciousness status unknown, subsequent encounter: Secondary | ICD-10-CM
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
## 2022-02-11 NOTE — Progress Notes (Signed)
Patient D/C this morning. Patient declined education. Patient declined being walked out, or the use of a wheel chair.  ?
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
## 2022-02-22 ENCOUNTER — Ambulatory Visit (INDEPENDENT_AMBULATORY_CARE_PROVIDER_SITE_OTHER): Payer: Self-pay | Admitting: Family

## 2022-02-22 ENCOUNTER — Encounter: Payer: Self-pay | Admitting: Family

## 2022-02-22 VITALS — BP 104/71 | HR 68 | Temp 98.3°F | Resp 18 | Ht 70.47 in | Wt 187.0 lb

## 2022-02-22 DIAGNOSIS — Z79899 Other long term (current) drug therapy: Secondary | ICD-10-CM

## 2022-02-22 DIAGNOSIS — S069XAD Unspecified intracranial injury with loss of consciousness status unknown, subsequent encounter: Secondary | ICD-10-CM

## 2022-02-22 DIAGNOSIS — Z09 Encounter for follow-up examination after completed treatment for conditions other than malignant neoplasm: Secondary | ICD-10-CM

## 2022-02-22 DIAGNOSIS — Z789 Other specified health status: Secondary | ICD-10-CM

## 2022-02-22 DIAGNOSIS — Z7689 Persons encountering health services in other specified circumstances: Secondary | ICD-10-CM

## 2022-02-22 NOTE — Progress Notes (Signed)
Pt presents to establish care 

## 2022-02-22 NOTE — Patient Instructions (Signed)
La vida despus de una lesin cerebral traumtica Living With Traumatic Brain Injury Una lesin cerebral traumtica (LCT) es una lesin en el cerebro que puede ser leve, moderada o grave. Los sntomas de cualquier tipo de LCT pueden ser a Air cabin crew (crnicos). Segn la zona del cerebro que se vea afectada, la LCT puede alterar la visin, la memoria, la concentracin, el razonamiento, el [place], el equilibrio, el sentido del tacto y el sueo. La LCT tambin puede causar sntomas crnicos como dolor de [place] o [place]. Usted puede tomar medidas para ayudar a su recuperacin y facilitar la convivencia con esta afeccin. Cmo manejar los cambios en el estilo de vida Despus de una LCT, podra tener que hacer cambios en su estilo de vida para recuperarse de la mejor forma posible. El tiempo que tarde en recuperarse y cun bien lo haga dependern de la gravedad de la lesin. Seguir un plan de rehabilitacin Es probable que trabaje con especialistas para desarrollar un plan de rehabilitacin que lo ayude a volver a Programme researcher, broadcasting/film/video. El equipo de atencin mdica podra estar conformado por: Fisioterapeutas o terapeutas ocupacionales. Fonoaudilogos. Asesores psicolgicos. Mdicos, como su mdico de atencin primaria o Diplomatic Services operational officer. Manejar los [employer] en las actividades Siga las indicaciones del mdico respecto de las actividades que debe limitar o Energy manager se recupera. Esto puede incluir: No asistir durante un tiempo al trabajo o a la escuela, segn la lesin. Evitar las [employer] que pudiera sufrir otra lesin en la cabeza, como practicar ftbol o ftbol americano, hockey, baloncesto, artes [place], deportes de nieve cuesta abajo y andar a caballo. No realice estas actividades hasta que el mdico lo autorice. No conducir. Posiblemente la lesin afecte su capacidad para conducir de IT consultant. Pida a familiares o amigos, o a un servicio de transporte que lo ayuden a  trasladarse y asistir a sus consultas. Realcese una evaluacin profesional para controlar su capacidad para Company secretary. Recurra a servicios de ayuda para que lo ayuden a Programme researcher, broadcasting/film/video a Company secretary. Podran incluir equipos adaptativos y de capacitacin.  Instrucciones generales Mantenga una rutina diaria uniforme. El reposo favorece la curacin del cerebro. Asegrese de hacer lo siguiente: Duerma bien por la noche. Evite quedarse despierto [employer] tarde. [place]ase a la [employer]. Descanse Administrator. Tome siestas durante el da o descanse cuando se sienta cansado. Evite sobreesforzar los ojos. Es posible que deba limitar el tiempo que pasa trabajando en la computadora, mirando televisin y [place]. Haga listas, coloque recordatorios o [employer] un planificador diario para ayudar a Press photographer. Dese mucho tiempo para Education officer, environmental las tareas cotidianas, como ir de compras, pagar las cuentas y Chief of Staff ropa. Concntrese en una tarea por vez. Cmo reconocer y Dietitian estrs Ciertas actividades y situaciones pueden ser ms difciles de manejar despus de una lesin cerebral. Esto puede hacer que se sienta estresado. Estos son signos que indican que puede estar estresado: Sentirse irritable, enojado o frustrado. Sentirse preocupado o ansioso. Evitar el contacto con Economist. Tener frecuencia cardaca acelerada. Encontrar formas saludables de Sales executive estrs puede ayudar a que una situacin difcil sea ms fcil de [place]. Esto puede incluir: Public house manager que causan estrs. Realizar respiraciones profundas, practicar yoga o meditar. Escuchar Public house manager o pasar tiempo al [employer]. Siga estas instrucciones en su casa: Use los medicamentos de venta libre y los recetados solamente como se lo haya indicado el mdico. No tome aspirina ni otros antiinflamatorios, como ibuprofeno o naproxeno, salvo  que el mdico lo autorice. No tome grandes cantidades de cafena. Quiz su  organismo est ms sensible a la cafena luego de la lesin. No consuma ningn producto que contenga nicotina o tabaco, como cigarrillos, cigarrillos electrnicos y tabaco de Theatre manager. Si necesita ayuda para dejar de fumar, consulte al mdico. No consuma drogas. No consuma alcohol hasta que el mdico lo autorice. Quiz su organismo est ms sensible a la cafena luego de la lesin. El alcohol puede lentificar la recuperacin. No conduzca hasta que el mdico diga que es [place]. Concurra a todas las visitas de [address] se lo haya indicado el mdico. Esto es importante. Dnde buscar apoyo Hable con su empleador, compaeros de trabajo, docentes o asesores escolares sobre la lesin. Lucienne Capers juntos para Public librarian plan para Education officer, environmental las tareas [place] se recupera. Hable con otras personas que tambin hayan sufrido una LCT. nase a un grupo de apoyo donde Cisco personas que hayan sufrido una LCT. Dgales a sus familiares y amigos qu pueden hacer para ayudarlo. Por ejemplo, ayudarlo en su hogar o llevarlo a las consultas. Si no puede continuar trabajando despus de la lesin, hable con un trabajador social sobre alternativas para satisfacer sus necesidades econmicas. Busque informacin y Retail buyer; pngase en contacto con Estate agent Injury Information [facility] [place]) [facility] de Informacin sobre Lesiones [place]) al [phone]. Si usted es Civil engineer, contracting, busque otros recursos, como: Optician, dispensing and [employer] Traumatic Brain Injury Center of Excellence (Centro de Excelencia para Hotel manager Traumticas del Departamento de [place] y Asuntos de Advice worker): [email or web address] Lnea de atencin de crisis de militares y Advice worker del Department of [address] (Departamento de Asuntos de [place]): [phone]. Comunquese con un mdico si: Tiene sntomas nuevos o que empeoran, por ejemplo: Mareos. Dolor de [place]. Ansiedad o  depresin. Irritabilidad. Confusin. Sensibilidad extrema a la luz o al sonido. Nuseas o vmitos. Solicite ayuda de inmediato si: Tiene una crisis de movimientos que no puede controlar (convulsiones). Resumen Una lesin cerebral traumtica (LCT) es una lesin en el cerebro que puede dificultar la visin, la memoria, la concentracin, el [place], el [place], el equilibrio, el sentido del tacto y el sueo. Una LCT tambin puede causar sntomas de larga duracin (crnicos), como dolor de cabeza o [place]. Despus de una LCT, podra tener que hacer muchos cambios en su estilo de vida para recuperarse de la mejor forma posible. El tiempo que tarde en recuperarse y cun bien lo haga dependern de la gravedad de la lesin. El reposo favorece la curacin del cerebro. Asegrese de dormir bien por la noche. Evite quedarse despierto [employer] tarde. Hable con su familia, amigos, empleador, compaeros de trabajo, docentes o asesores escolares sobre la lesin. Lucienne Capers juntos para Public librarian plan para Education officer, environmental las tareas [place] se recupera. Hable con otras personas que tambin hayan sufrido una LCT. nase a un grupo de apoyo donde [employer] personas que hayan sufrido una LCT. Esta informacin no tiene Theme park manager el consejo del mdico. Asegrese de hacerle al mdico cualquier pregunta que tenga. Document Revised: 12/09/2020 Document Reviewed: 12/09/2020 Elsevier Patient Education  2023 [employer].

## 2022-02-23 ENCOUNTER — Telehealth: Payer: Self-pay | Admitting: Family

## 2022-02-23 NOTE — Telephone Encounter (Signed)
Called pt to inform an appt for Financial Assistance is Not being made at the moment (No Artist is available in the department).    Pt needs to only MAIL his application to : [facility] BUSINESS OFFICE- Attention to : Customer Service: Address: [address] [place], [place] [address]    I left a message w/ this information for pt.  If he needs another application he is welcome to pick one up at [facility] 8-5 CLOSED 12-1 AND closed this coming Monday for the Holiday.    Thank you.

## 2022-03-06 ENCOUNTER — Ambulatory Visit: Payer: Self-pay | Admitting: *Deleted
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
## 2022-03-06 NOTE — Telephone Encounter (Signed)
Used Spanish interpreter Daniella # [phone], left message to call back about his questions.

## 2022-03-06 NOTE — Telephone Encounter (Signed)
3rd attempt to return his call without success.   Used [employer] [phone].  Per policy I have forwarded this to Primary Care at [facility].
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
## 2022-03-06 NOTE — Telephone Encounter (Signed)
James Howell at [facility] will call pt.

## 2022-03-06 NOTE — Telephone Encounter (Signed)
Using [employer] for Spanish [phone] attempted to return his call.   Left a voicemail to call back to discuss his questions with a nurse.

## 2022-03-07 ENCOUNTER — Encounter: Payer: Self-pay | Admitting: Occupational Therapy

## 2022-03-07 ENCOUNTER — Ambulatory Visit: Payer: Self-pay | Admitting: Occupational Therapy

## 2022-03-07 ENCOUNTER — Telehealth: Payer: Self-pay | Admitting: Family

## 2022-03-07 ENCOUNTER — Encounter: Payer: Self-pay | Admitting: Speech Pathology

## 2022-03-07 ENCOUNTER — Ambulatory Visit: Payer: Self-pay | Admitting: Speech Pathology

## 2022-03-07 ENCOUNTER — Encounter: Payer: Self-pay | Admitting: Physical Therapy

## 2022-03-07 ENCOUNTER — Ambulatory Visit: Payer: Self-pay | Attending: Physical Medicine and Rehabilitation | Admitting: Physical Therapy

## 2022-03-07 DIAGNOSIS — M6281 Muscle weakness (generalized): Secondary | ICD-10-CM

## 2022-03-07 DIAGNOSIS — R2681 Unsteadiness on feet: Secondary | ICD-10-CM | POA: Insufficient documentation

## 2022-03-07 DIAGNOSIS — R41840 Attention and concentration deficit: Secondary | ICD-10-CM | POA: Insufficient documentation

## 2022-03-07 DIAGNOSIS — S069X0A Unspecified intracranial injury without loss of consciousness, initial encounter: Secondary | ICD-10-CM | POA: Insufficient documentation

## 2022-03-07 DIAGNOSIS — R41841 Cognitive communication deficit: Secondary | ICD-10-CM

## 2022-03-07 NOTE — Therapy (Signed)
OUTPATIENT SPEECH LANGUAGE PATHOLOGY EVALUATION   Patient Name: James Howell MRN: [id number] DOB:[birth year], 26 y.o., male Today's Date: 03/07/2022  PCP: Rema Fendt, NP REFERRING PROVIDER: Jacquelynn Cree, PA-C    History reviewed. No pertinent past medical history. History reviewed. No pertinent surgical history. Patient Active Problem List   Diagnosis Date Noted   TBI (traumatic brain injury) (HCC) 02/04/2022   Traumatic brain injury (HCC) 01/21/2022    ONSET DATE: 01/21/22   REFERRING DIAG:  P38.9XAD (ICD-10-CM) - Traumatic brain injury, with unknown loss of consciousness status, subsequent encounter  Z79.899 (ICD-10-CM) - On deep vein thrombosis (DVT) prophylaxis    THERAPY DIAG:  Cognitive communication deficit  Rationale for Evaluation and Treatment Rehabilitation  SUBJECTIVE:   SUBJECTIVE STATEMENT: Pt was pleasant and cooperative throughout assessment. Pt accompanied by: self and interpreter:    PERTINENT HISTORY: Unremarkable prior to TBI  PAIN:  Are you having pain? No   FALLS: Has patient fallen in last 6 months?  No  LIVING ENVIRONMENT: Lives with: lives with their family Lives in: House/apartment  PLOF:  Level of assistance: Independent with ADLs, Independent with IADLs Employment: Full-time employment; Personnel officer    PATIENT GOALS: memory  OBJECTIVE:   DIAGNOSTIC FINDINGS:    FINDINGS 01/21/22: CT HEAD FINDINGS   Brain: A small amount of subarachnoid hemorrhage is noted within the RIGHT prepontine/CP angle cistern and along the anterior RIGHT LOWER cerebellum.   Small amount of hemorrhage is noted within the occipital horn of the LEFT LATERAL ventricle.   The RIGHT LATERAL ventricle is larger than the LEFT, likely an anatomic variant.   No acute infarct, subdural/epidural hemorrhage or mass lesion identified.   Vascular: No hyperdense vessel or unexpected calcification.   Skull: Normal. Negative for fracture or  focal lesion.   Sinuses/Orbits: No acute abnormality.   Other: LEFT forehead/scalp hematoma noted.    COGNITION: Overall cognitive status: Impaired: Attention: Impaired: Alternating, Divided and Memory: Impaired: Short term  COGNITIVE COMMUNICATION Following directions: Follows multi-step commands consistently w/ repetition Auditory comprehension: WFL Verbal expression: WFL Functional communication: Impaired: Requires repetition. SLP suspects auditory comprehension impairment is 2/2 attention.   ORAL MOTOR EXAMINATION WFL  STANDARDIZED ASSESSMENTS: CLQT: Initiated; will complete and score next session.   PATIENT REPORTED OUTCOME MEASURES (PROM): NA    PATIENT EDUCATION: Education details: Cognitive communication impairment Person educated: Patient Education method: Explanation, Demonstration, and Handouts Education comprehension: verbalized understanding and needs further education     GOALS: Goals reviewed with patient? Yes  SHORT TERM GOALS: Target date: 04/19/2022    Pt will verbalize 3 memory strategies to assist with recall of important information across 3 sessions. Baseline: Goal status: INITIAL  2.  Pt will verbalize 3 attention strategies to increase focus and comprehension during conversations across 3 sessions.  Baseline:  Goal status: INITIAL  3.  Pt will complete cognitive PROMS Baseline:  Goal status: INITIAL  LONG TERM GOALS: Target date: 05/31/2022    Pt will demonstrate and/or report use of memory strategies to recall important information. Baseline:  Goal status: INITIAL   Pt will demonstrate and/or report use of 3 attention strategies to increase focus comprehension during conversations across 3 sessions.  Baseline:  Goal status: INITIAL  ASSESSMENT:  CLINICAL IMPRESSION: Pt is a 26 yo male who presents to ST OP for evaluation post TBI. Pt endorses difficulty thinking, remembering, and concentrating. Pt was assessed using CLQT. To  complete next session. SLP observed lapses in concentration and memory throughout assessment. Pt required  repetition throughout assessment. SLP rec skilled ST services to address attention and memory to increase patient safety and maximize functional communication, as patient would like to return to work immediately.   OBJECTIVE IMPAIRMENTS include attention and memory. These impairments are limiting patient from return to work, managing medications, managing appointments, managing finances, and effectively communicating at home and in community. Factors affecting potential to achieve goals and functional outcome are  NA .Marland Kitchen Patient will benefit from skilled SLP services to address above impairments and improve overall function.  REHAB POTENTIAL: Good  PLAN: SLP FREQUENCY: 1x/week  SLP DURATION: 12 weeks  PLANNED INTERVENTIONS: Environmental controls, Cueing hierachy, Internal/external aids, Functional tasks, Multimodal communication approach, SLP instruction and feedback, Compensatory strategies, and Patient/family education    [place], CCC-SLP 03/07/2022, 1:27 PM
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
## 2022-03-07 NOTE — Telephone Encounter (Signed)
Called pt to give information about latest msg and financial assistance, my call woke the pt and asked to call back later.
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
## 2022-03-08 ENCOUNTER — Encounter: Payer: Self-pay | Admitting: Physical Medicine & Rehabilitation

## 2022-03-08 ENCOUNTER — Encounter: Payer: Self-pay | Attending: Physical Medicine & Rehabilitation | Admitting: Physical Medicine & Rehabilitation

## 2022-03-08 VITALS — BP 110/75 | HR 69 | Ht 70.47 in | Wt 189.4 lb

## 2022-03-08 DIAGNOSIS — S069XAS Unspecified intracranial injury with loss of consciousness status unknown, sequela: Secondary | ICD-10-CM

## 2022-03-08 MED ORDER — QUETIAPINE FUMARATE 50 MG PO TABS: 50.0000 mg | ORAL_TABLET | Freq: Every day | ORAL | 4 refills | Status: DC

## 2022-03-08 NOTE — Patient Instructions (Addendum)
YOU NEED TO WORK ON STRENGTHENING YOUR LEFT LEG. IT HAS GOTTEN A LOT STRONGER ALREADY, BUT IT NEEDS TO BE EVEN BETTER TO CLIMB LADDERS AND TO WORK IN UNSAFE SPOTS. YOU COULD RIDE A BIKE, WALK, DO SOME WEIGHT LIFTING     YOU MAY NEED TO WRITE NOTES TO HELP REMEMBER THINGS THAT PEOPLE SAY TO YOU. MAKE A HABIT OF REFERRING TO YOUR NOTES ON A DAILY BASIS.    YOU NEED TO BRING A GOOD-RX CARD WITH YOU TO THE [facility] ON [address] TO FILL THE MEDICATION FOR SLEEP (SEROQUEL)
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
## 2022-03-13 ENCOUNTER — Ambulatory Visit: Payer: Self-pay | Admitting: Speech Pathology

## 2022-03-13 ENCOUNTER — Ambulatory Visit: Payer: Self-pay

## 2022-03-20 ENCOUNTER — Ambulatory Visit: Payer: Self-pay

## 2022-03-20 ENCOUNTER — Ambulatory Visit: Payer: Self-pay | Admitting: Speech Pathology
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
## 2022-03-24 ENCOUNTER — Ambulatory Visit: Payer: Self-pay | Admitting: Speech Pathology

## 2022-03-24 ENCOUNTER — Ambulatory Visit: Payer: Self-pay

## 2022-03-24 ENCOUNTER — Encounter: Payer: Self-pay | Admitting: Speech Pathology

## 2022-03-24 DIAGNOSIS — R2681 Unsteadiness on feet: Secondary | ICD-10-CM

## 2022-03-24 DIAGNOSIS — R41841 Cognitive communication deficit: Secondary | ICD-10-CM

## 2022-03-24 DIAGNOSIS — M6281 Muscle weakness (generalized): Secondary | ICD-10-CM

## 2022-03-24 DIAGNOSIS — S069X0A Unspecified intracranial injury without loss of consciousness, initial encounter: Secondary | ICD-10-CM

## 2022-03-27 ENCOUNTER — Encounter: Payer: Self-pay | Admitting: Speech Pathology

## 2022-03-27 ENCOUNTER — Ambulatory Visit: Payer: Self-pay

## 2022-03-27 ENCOUNTER — Ambulatory Visit: Payer: Self-pay | Admitting: Speech Pathology

## 2022-03-27 DIAGNOSIS — R41841 Cognitive communication deficit: Secondary | ICD-10-CM

## 2022-03-27 DIAGNOSIS — R2681 Unsteadiness on feet: Secondary | ICD-10-CM

## 2022-03-27 DIAGNOSIS — S069X0A Unspecified intracranial injury without loss of consciousness, initial encounter: Secondary | ICD-10-CM

## 2022-03-27 DIAGNOSIS — M6281 Muscle weakness (generalized): Secondary | ICD-10-CM

## 2022-07-12 ENCOUNTER — Encounter: Payer: MEDICAID | Attending: Physical Medicine & Rehabilitation | Admitting: Physical Medicine & Rehabilitation

## 2023-02-15 ENCOUNTER — Ambulatory Visit
Admission: EM | Admit: 2023-02-15 | Discharge: 2023-02-15 | Disposition: A | Discharge status: Home / Self Care | Payer: Self-pay | Attending: Family Medicine | Admitting: Family Medicine

## 2023-02-15 DIAGNOSIS — H1032 Unspecified acute conjunctivitis, left eye: Secondary | ICD-10-CM

## 2023-02-15 MED ORDER — GENTAMICIN SULFATE 0.3 % OP SOLN: 2.0000 [drp] | Freq: Three times a day (TID) | OPHTHALMIC | 0 refills | Status: AC

## 2023-02-15 NOTE — Discharge Instructions (Addendum)
Put gentamicin eyedrops in the affected eye(s) 3 times daily for 5 days. (Ponga gentamicin en el (los) ojo(s) afectado(os)--2 gotas 3 veces al dia por 5 dias)  Cool compresses can help how it feels (compresas con agua fresco puede ayudar)

## 2023-02-15 NOTE — ED Provider Notes (Signed)
[facility]    CSN: [id number] Arrival date & time: 02/15/23  1222      History   Chief Complaint Chief Complaint  Patient presents with   Conjunctivitis    HPI James Howell is a 27 y.o. male.    Conjunctivitis   Here for left eye redness and irritation since last night. No injury or FB. No contacts.  No f/c/cough/congestion  NKDA   History reviewed. No pertinent past medical history.  Patient Active Problem List   Diagnosis Date Noted   TBI (traumatic brain injury) (HCC) 02/04/2022   Traumatic brain injury (HCC) 01/21/2022    History reviewed. No pertinent surgical history.     Home Medications    Prior to Admission medications   Medication Sig Start Date End Date Taking? Authorizing Provider  gentamicin (GARAMYCIN) 0.3 % ophthalmic solution Place 2 drops into the left eye 3 (three) times daily for 5 days. 02/15/23 02/20/23 Yes Zenia Resides, MD    Family History History reviewed. No pertinent family history.  Social History Social History   Tobacco Use   Smoking status: Never    Passive exposure: Never   Smokeless tobacco: Never  Vaping Use   Vaping Use: Never used  Substance Use Topics   Alcohol use: Not Currently    Comment: socially   Drug use: Never     Allergies   Patient has no known allergies.   Review of Systems Review of Systems   Physical Exam Triage Vital Signs ED Triage Vitals  Enc Vitals Group     BP 02/15/23 1246 105/71     Pulse Rate 02/15/23 1246 (!) 55     Resp 02/15/23 1246 16     Temp 02/15/23 1246 98.3 F (36.8 C)     Temp Source 02/15/23 1246 Oral     SpO2 02/15/23 1246 96 %     Weight --      Height --      Head Circumference --      Peak Flow --      Pain Score 02/15/23 1245 0     Pain Loc --      Pain Edu? --      Excl. in GC? --    No data found.  Updated Vital Signs BP 105/71 (BP Location: Left Arm)   Pulse (!) 55   Temp 98.3 F (36.8 C) (Oral)   Resp 16   SpO2  96%   Visual Acuity Right Eye Distance:   Left Eye Distance:   Bilateral Distance:    Right Eye Near:   Left Eye Near:    Bilateral Near:     Physical Exam Vitals reviewed.  Constitutional:      General: He is not in acute distress.    Appearance: He is not ill-appearing, toxic-appearing or diaphoretic.  HENT:     Nose: Nose normal.     Mouth/Throat:     Mouth: Mucous membranes are moist.  Eyes:     Extraocular Movements: Extraocular movements intact.     Pupils: Pupils are equal, round, and reactive to light.     Comments: Left conjunctiva is injected.  There is a little bit of discharge at the inner canthus and in the outer canthus.  Skin:    Coloration: Skin is not pale.  Neurological:     Mental Status: He is alert and oriented to person, place, and time.  Psychiatric:        Behavior: Behavior  normal.      UC Treatments / Results  Labs (all labs ordered are listed, but only abnormal results are displayed) Labs Reviewed - No data to display  EKG   Radiology No results found.  Procedures Procedures (including critical care time)  Medications Ordered in UC Medications - No data to display  Initial Impression / Assessment and Plan / UC Course  I have reviewed the triage vital signs and the nursing notes.  Pertinent labs & imaging results that were available during my care of the patient were reviewed by me and considered in my medical decision making (see chart for details).        Gentamicin is sent in for the conjunctivitis. Final Clinical Impressions(s) / UC Diagnoses   Final diagnoses:  Acute conjunctivitis of left eye, unspecified acute conjunctivitis type     Discharge Instructions      Put gentamicin eyedrops in the affected eye(s) 3 times daily for 5 days. (Ponga gentamicin en el (los) ojo(s) afectado(os)--2 gotas 3 veces al dia por 5 dias)  Cool compresses can help how it feels (compresas con agua fresco puede ayudar)     ED  Prescriptions     Medication Sig Dispense Auth. Provider   gentamicin (GARAMYCIN) 0.3 % ophthalmic solution Place 2 drops into the left eye 3 (three) times daily for 5 days. 5 mL Zenia Resides, MD      I have reviewed the PDMP during this encounter.   Zenia Resides, MD 02/15/23 [id number]

## 2023-02-15 NOTE — ED Triage Notes (Signed)
Pt states via interpretor that he woke up this morning and his left eye was red and swollen.

## 2023-02-23 ENCOUNTER — Other Ambulatory Visit (HOSPITAL_COMMUNITY): Payer: Self-pay

## 2023-03-26 IMAGING — DX DG ABD PORTABLE 1V
1 series · 1 of 1 positions shown · non-contrast
Comparison: January 21, 2022

CLINICAL DATA: Encounter for feeding tube placement.

EXAM:
PORTABLE ABDOMEN - 1 VIEW

[abdomen]
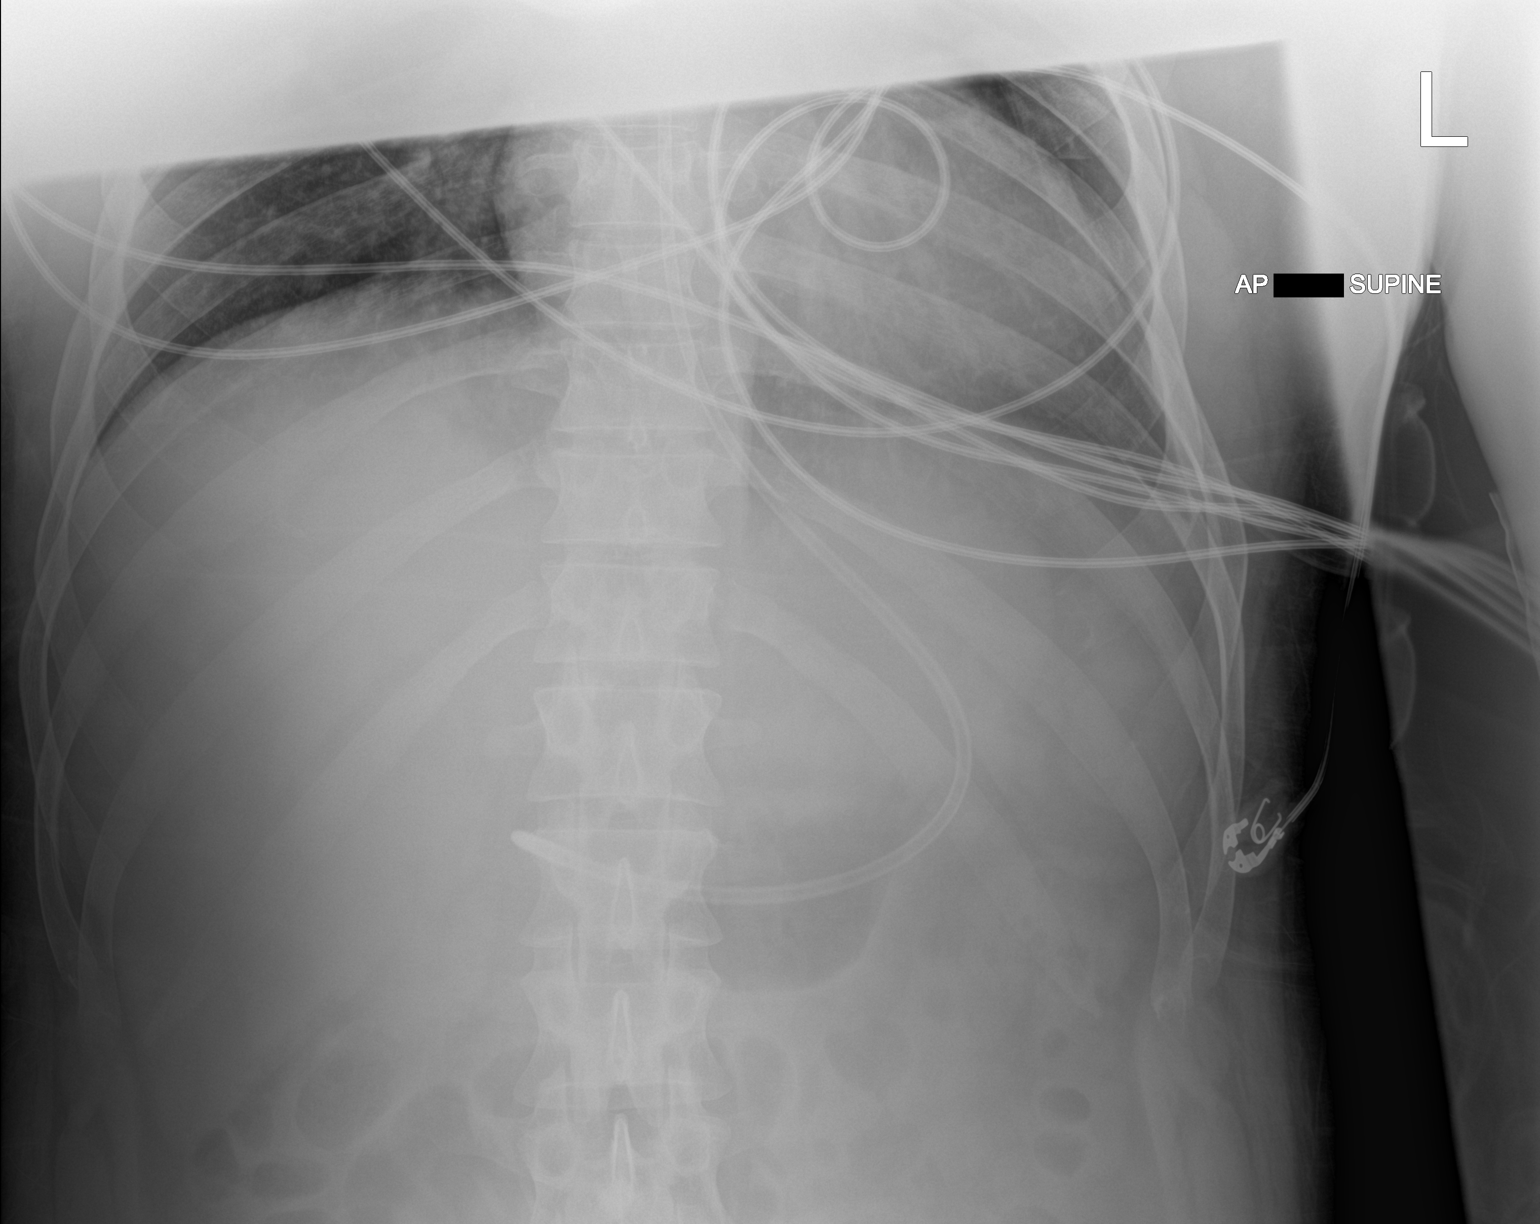

[1 of 1 positions shown; findings below may reference images not displayed]

FINDINGS: The bowel gas pattern is normal. Feeding tube is identified with
distal tip in the distal stomach.
IMPRESSION: Feeding tube is identified with distal tip in the distal stomach.

## 2023-04-06 IMAGING — DX DG WRIST 2V*L*
1 series · 2 of 2 positions shown · non-contrast
Comparison: None Available.

CLINICAL DATA: Left wrist pain.  No known injury.

EXAM:
LEFT WRIST - 2 VIEW

[Series 1: wrist · 0.14mm/px · 2 of 2 slices shown]
[im 1/2]
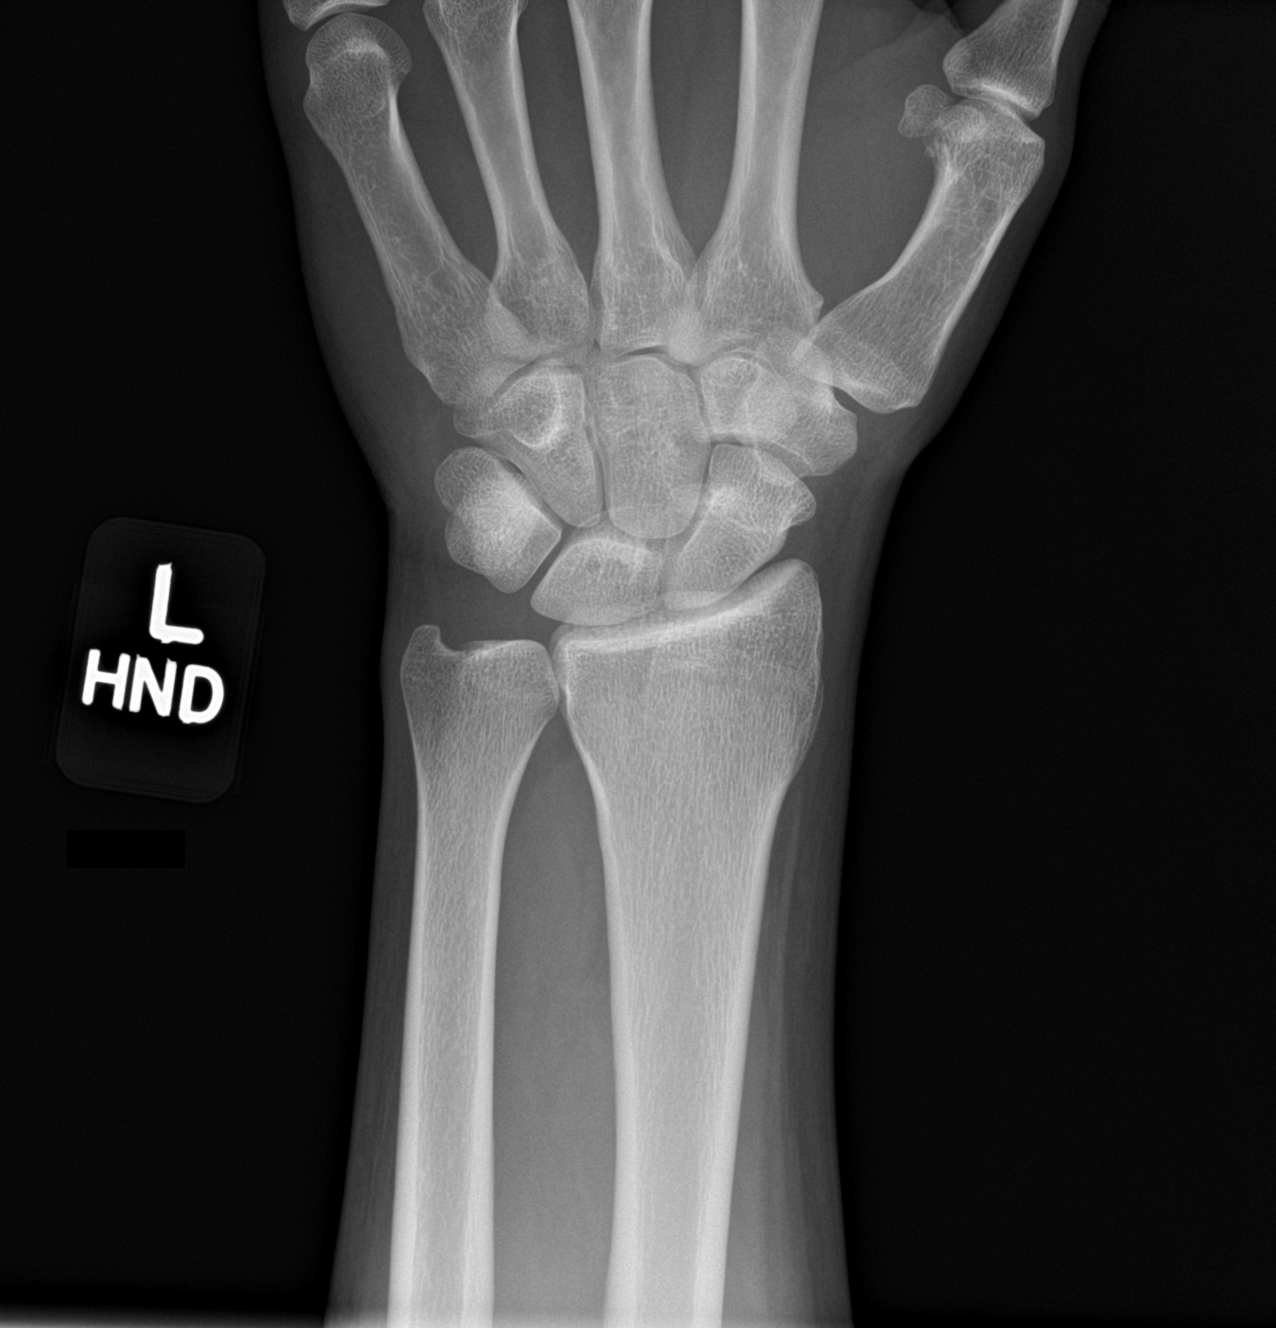
[im 2/2]
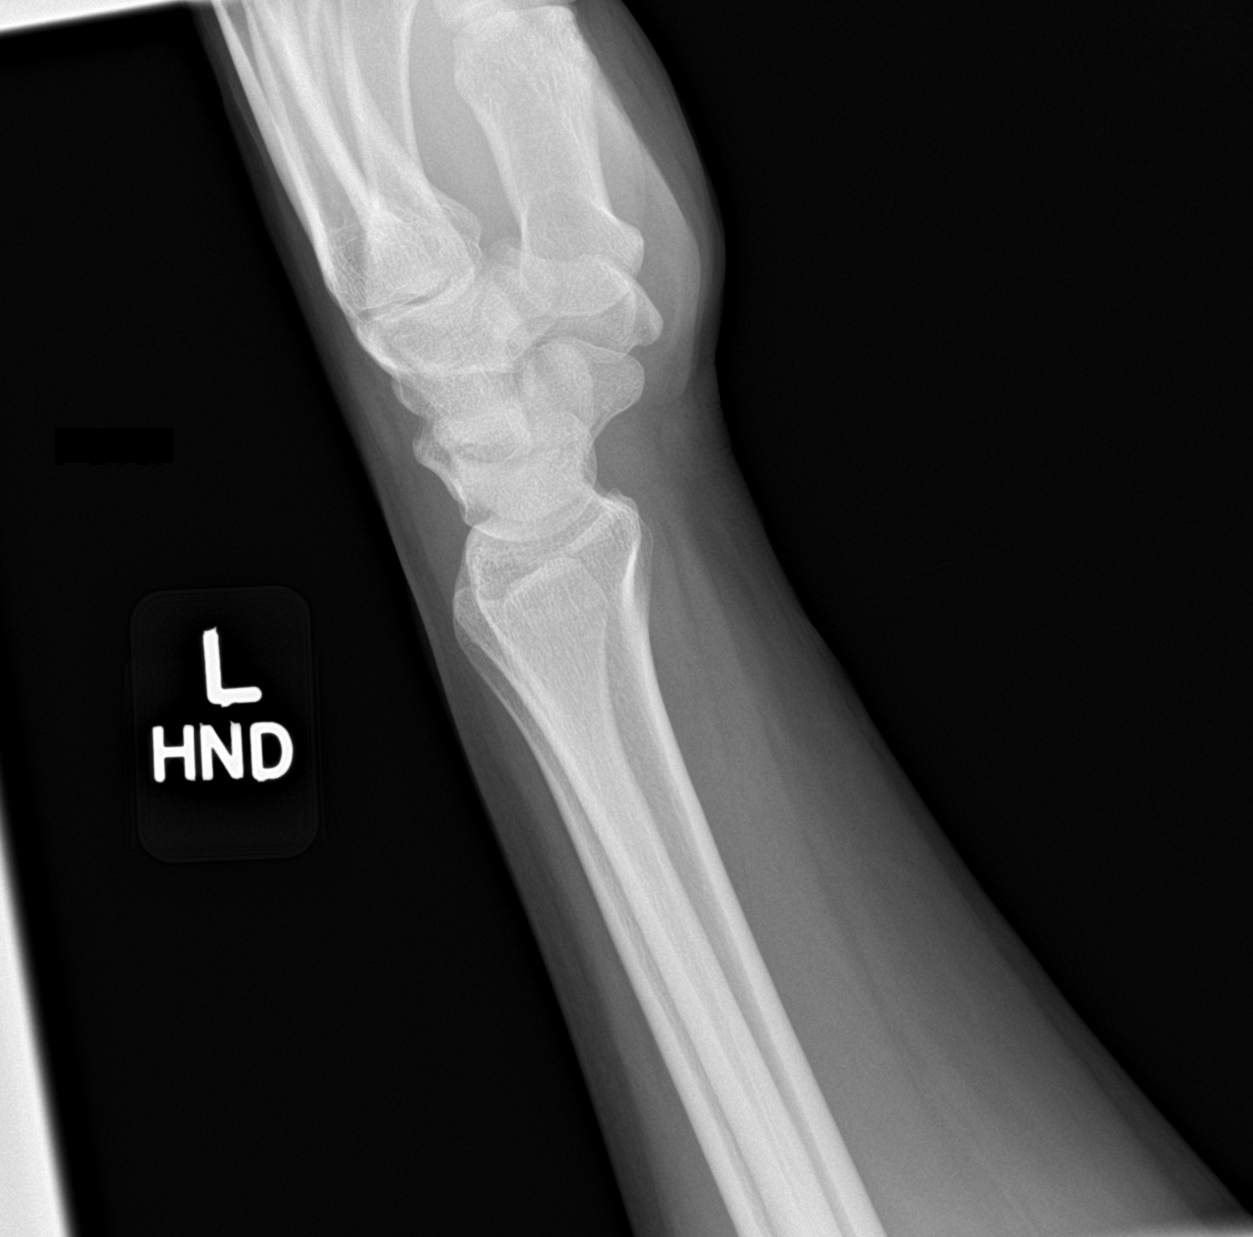

[2 of 2 positions shown; findings below may reference images not displayed]

FINDINGS: No fracture or dislocation. Joint spaces are preserved. No erosions.
No evidence of chondrocalcinosis. Regional soft tissues appear
normal.
IMPRESSION: No explanation for patient's left wrist pain.

## 2023-04-30 ENCOUNTER — Encounter: Payer: Self-pay | Admitting: Family

## 2023-04-30 ENCOUNTER — Ambulatory Visit (INDEPENDENT_AMBULATORY_CARE_PROVIDER_SITE_OTHER): Payer: Self-pay | Admitting: Family

## 2023-04-30 VITALS — BP 102/64 | HR 68 | Temp 98.3°F | Ht 71.25 in | Wt 184.2 lb

## 2023-04-30 DIAGNOSIS — Z758 Other problems related to medical facilities and other health care: Secondary | ICD-10-CM

## 2023-04-30 DIAGNOSIS — S069XAD Unspecified intracranial injury with loss of consciousness status unknown, subsequent encounter: Secondary | ICD-10-CM

## 2023-04-30 DIAGNOSIS — Z603 Acculturation difficulty: Secondary | ICD-10-CM
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
## 2023-05-07 ENCOUNTER — Telehealth: Payer: Self-pay | Admitting: Family

## 2023-05-07 NOTE — Telephone Encounter (Signed)
Copied from CRM [id number]. Topic: General - Other >> May 07, 2023  2:43 PM [place] B wrote: Reason for CRM: Marquis Lunch from [facility] neuro rehab, about a referral received, but didn't state was it was for PT OT? Etc. Please call back

## 2023-05-07 NOTE — Telephone Encounter (Signed)
Neuro called to confirm the reason for the referral . It was unclear if the referral was regarding PT or OT. Sent message to the provider to confirm what type of therapy was needed.

## 2023-05-08 ENCOUNTER — Other Ambulatory Visit: Payer: Self-pay | Admitting: Family

## 2023-05-08 DIAGNOSIS — S069XAD Unspecified intracranial injury with loss of consciousness status unknown, subsequent encounter: Secondary | ICD-10-CM

## 2023-05-08 NOTE — Telephone Encounter (Signed)
-   During recent office visit patient reported he was referred to Neurosurgery in the past and they never called him to schedule an appointment. New order for Neurosurgery ordered on 04/30/2023.  - Rehabilitation referral is for Physical Therapy/Occupational Therapy. Additional orders placed on today for the same.

## 2023-05-09 NOTE — Telephone Encounter (Signed)
Spoke with [place] from Neuro Rehab, old referral didn't have discipline needed for referral, she confirmed new referral can be processed.
